# Patient Record
Sex: Male | Born: 1945 | Race: White | Hispanic: No | Marital: Married | Smoking: Never smoker
Health system: Southern US, Community
[De-identification: ages and names within clinical notes are randomized; demographics above are authoritative.]

## PROBLEM LIST (undated history)

## (undated) DIAGNOSIS — E785 Hyperlipidemia, unspecified: Secondary | ICD-10-CM

## (undated) DIAGNOSIS — N200 Calculus of kidney: Secondary | ICD-10-CM

## (undated) DIAGNOSIS — M1990 Unspecified osteoarthritis, unspecified site: Secondary | ICD-10-CM

## (undated) DIAGNOSIS — I10 Essential (primary) hypertension: Secondary | ICD-10-CM

## (undated) DIAGNOSIS — I4891 Unspecified atrial fibrillation: Secondary | ICD-10-CM

## (undated) DIAGNOSIS — N189 Chronic kidney disease, unspecified: Secondary | ICD-10-CM

## (undated) DIAGNOSIS — M109 Gout, unspecified: Secondary | ICD-10-CM

## (undated) DIAGNOSIS — D044 Carcinoma in situ of skin of scalp and neck: Secondary | ICD-10-CM

## (undated) DIAGNOSIS — K219 Gastro-esophageal reflux disease without esophagitis: Secondary | ICD-10-CM

## (undated) HISTORY — PX: REPLACEMENT TOTAL KNEE: SUR1224

## (undated) HISTORY — PX: CATARACT EXTRACTION: SUR2

## (undated) HISTORY — PX: COLONOSCOPY WITH ESOPHAGOGASTRODUODENOSCOPY (EGD): SHX5779

## (undated) HISTORY — DX: Carcinoma in situ of skin of scalp and neck: D04.4

---

## 2006-10-21 ENCOUNTER — Ambulatory Visit: Payer: Self-pay | Admitting: Unknown Physician Specialty

## 2009-12-26 ENCOUNTER — Ambulatory Visit: Payer: Self-pay | Admitting: General Practice

## 2012-04-25 ENCOUNTER — Ambulatory Visit: Payer: Self-pay | Admitting: General Practice

## 2012-04-25 LAB — URINALYSIS, COMPLETE
Bacteria: NONE SEEN
Blood: NEGATIVE
Glucose,UR: NEGATIVE mg/dL (ref 0–75)
Ketone: NEGATIVE
Protein: NEGATIVE
Specific Gravity: 1.015 (ref 1.003–1.030)
Squamous Epithelial: NONE SEEN
WBC UR: 3 /HPF (ref 0–5)

## 2012-04-25 LAB — BASIC METABOLIC PANEL
Anion Gap: 9 (ref 7–16)
BUN: 11 mg/dL (ref 7–18)
EGFR (African American): >60
Glucose: 96 mg/dL (ref 65–99)
Osmolality: 281 (ref 275–301)
Sodium: 141 mmol/L (ref 136–145)

## 2012-04-25 LAB — CBC
HGB: 14.8 g/dL (ref 13.0–18.0)
Platelet: 178 10*3/uL (ref 150–440)
RBC: 4.73 10*6/uL (ref 4.40–5.90)
WBC: 5.5 10*3/uL (ref 3.8–10.6)

## 2012-04-25 LAB — APTT: Activated PTT: 30.5 secs (ref 23.6–35.9)

## 2012-04-25 LAB — PROTIME-INR
INR: 1
Prothrombin Time: 13.2 secs (ref 11.5–14.7)

## 2012-04-25 LAB — MRSA PCR SCREENING

## 2012-04-26 LAB — URINE CULTURE

## 2012-05-09 ENCOUNTER — Inpatient Hospital Stay: Payer: Self-pay

## 2012-05-10 LAB — BASIC METABOLIC PANEL
Anion Gap: 9 (ref 7–16)
BUN: 11 mg/dL (ref 7–18)
Calcium, Total: 7.8 mg/dL — ABNORMAL LOW (ref 8.5–10.1)
Chloride: 102 mmol/L (ref 98–107)
Co2: 26 mmol/L (ref 21–32)
EGFR (Non-African Amer.): >60
Osmolality: 274 (ref 275–301)
Potassium: 4 mmol/L (ref 3.5–5.1)
Sodium: 137 mmol/L (ref 136–145)

## 2012-05-10 LAB — HEMOGLOBIN: HGB: 13.1 g/dL (ref 13.0–18.0)

## 2012-05-11 LAB — BASIC METABOLIC PANEL
Anion Gap: 7 (ref 7–16)
BUN: 8 mg/dL (ref 7–18)
Calcium, Total: 7.6 mg/dL — ABNORMAL LOW (ref 8.5–10.1)
Chloride: 100 mmol/L (ref 98–107)
Co2: 27 mmol/L (ref 21–32)
EGFR (Non-African Amer.): >60
Potassium: 3.6 mmol/L (ref 3.5–5.1)

## 2012-05-11 LAB — HEMOGLOBIN: HGB: 12.2 g/dL — ABNORMAL LOW (ref 13.0–18.0)

## 2012-05-11 LAB — PLATELET COUNT: Platelet: 135 10*3/uL — ABNORMAL LOW (ref 150–440)

## 2014-05-26 DIAGNOSIS — M1990 Unspecified osteoarthritis, unspecified site: Secondary | ICD-10-CM | POA: Insufficient documentation

## 2014-05-26 DIAGNOSIS — M109 Gout, unspecified: Secondary | ICD-10-CM | POA: Diagnosis present

## 2014-05-26 DIAGNOSIS — I10 Essential (primary) hypertension: Secondary | ICD-10-CM | POA: Diagnosis present

## 2014-07-09 DIAGNOSIS — D044 Carcinoma in situ of skin of scalp and neck: Secondary | ICD-10-CM

## 2014-07-09 DIAGNOSIS — C4492 Squamous cell carcinoma of skin, unspecified: Secondary | ICD-10-CM

## 2014-07-09 HISTORY — DX: Squamous cell carcinoma of skin, unspecified: C44.92

## 2014-07-09 HISTORY — DX: Carcinoma in situ of skin of scalp and neck: D04.4

## 2014-09-07 DIAGNOSIS — E782 Mixed hyperlipidemia: Secondary | ICD-10-CM | POA: Insufficient documentation

## 2015-03-10 NOTE — Discharge Summary (Signed)
PATIENT NAMESONYA, [name] MR#:  [id number] DATE OF BIRTH:  [birth year]  DATE OF ADMISSION:  05/09/2012 DATE OF DISCHARGE:  05/12/2012  ADMITTING DIAGNOSIS: Degenerative arthrosis of the right knee.   DISCHARGE DIAGNOSIS: Degenerative arthrosis of the right knee.   HISTORY OF PRESENT ILLNESS: The patient is a 69 year old who has been [facility] of right knee pain. The patient had been experiencing discomfort in the right knee for approximately 2-1/2 to 3 years. There was no specific injury or any change of activities. The pain had initially began when he was walking. He was also noted to have pain with certain ways that he would twist the knee. He had not seen any significant improvement in his condition despite antiinflammatory medication. He states he has been having some problems off and on over the last 15 to 20 years secondary to the fact that he was doing judo and would subsequently twist the knee and reinjured it. The patient also states that at times he would be running, he used to run 14 to 15 miles at a time. The patient did undergo a series of Synvisc injections with only minimal relief. He had continued to have pain and subsequently had elected to proceed with total knee arthroplasty. The patient states that the pain had increased to the point that it was significantly interfering with his activities of daily living. X-rays taken in the clinic showed significant narrowing of the medial cartilage space with near bone-on-bone articulation noted. There was subchondral sclerosis noted. He was noted to have relative varus alignment. There was also evidence of degenerative changes to the patellofemoral articulation. After discussion of the risks and benefits of surgical intervention, the patient expressed his understanding of the risks and benefits and agreed with plans for surgical intervention.   PROCEDURE: Right total knee arthroplasty using computer-assisted  navigation.   ANESTHESIA: Femoral nerve block with spinal.   SOFT TISSUE RELEASE: Anterior cruciate ligament, posterior cruciate ligament, deep and superficial medial collateral ligaments, as well as patellofemoral ligament.   IMPLANTS UTILIZED: DePuy PFC Sigma size 4 posterior stabilized femoral component (cemented), size 5 MBT tibial component (cemented), 38 mm three pegged oval dome patella (cemented), and a 12.5 mm stabilized rotating platform polyethylene insert.   HOSPITAL COURSE: The patient tolerated the procedure very well. He had no complications. He was then taken to PAC-U where he was stabilized and then transferred to the orthopedic floor. The patient began receiving anticoagulation therapy of Lovenox 30 mg subcutaneous every 12 hours per anesthesia protocol. He was fitted with TED stockings bilaterally. These were allowed to be removed one hour per eight hour shift. The right one was applied on day two following removal of the Hemovac and dressing change. The patient was also fitted with the AV-I compression foot pumps set at 80 mmHg bilaterally. His calves have been nontender. There has been no evidence of any deep venous thromboses. Negative Homans sign. The patient's heels were elevated off the bed using rolled towels. He has voiced no complaints.   The patient has denied any chest pain or any shortness of breath. Vital signs have been stable. He has been afebrile. Hemodynamically he was stable and no transfusions were given other than the Autovac transfusions given the first six hours postoperatively.   Physical therapy was initiated on day one for gait training and transfers. Upon being discharged, he was ambulating greater than 200 feet. He was able go up and down four sets of steps.  He was independent with bed to chair transfers. The patient's range of motion was 0 to 96 degrees. Occupational therapy was also initiated on day one for activities of daily living and assistive devices.    The patient's IV, Foley and Hemovac were all discontinued on day two along with a dressing change. The wound was free of any drainage or any signs of infection. Polar Care was reapplied to the surgical leg maintaining a temperature of 40 to 50 degrees Fahrenheit.   The patient is being discharged to home in improved stable condition. He may weight bear as tolerated. Continue using a walker until cleared by physical therapy to go to a quad cane. He will receive home health physical therapy. He is to continue wearing TED stockings. These are to be worn during the day but may be removed at night. He is to continue to use the Polar Care as much as he can around-the-clock until seen in the office in two weeks. He does have a follow-up appointment in the clinic in two weeks. He is to call the clinic sooner if any temperatures of 101.5 or greater or excessive bleeding. He is placed on a regular diet. He is given a prescription for Lovenox 40 mg subcutaneously daily for 14 days and then discontinue and begin taking one 81 mg enteric-coated aspirin, also a prescription for Roxicodone 5 to 10 mg every 4 to 6 hours p.r.n. for pain and Ultram 50 mg 1 to 2 tablets every 4 to 6 hours p.r.n. for pain. He is to resume his regular medications that he was on prior to admission.   PAST MEDICAL HISTORY:  1. Hypertension.  2. Hyperlipidemia.  3. Gout.  4. Shingles.  5. Cataracts.  6. Kidney stones. ____________________________ Vance Peper, PA [id number] D: 05/12/2012 06:53:08 ET T: 05/12/2012 16:04:58 ET JOB#: [id number]  cc: Vance Peper, PA, <Dictator> [place] PA ELECTRONICALLY SIGNED 05/14/2012 21:09

## 2015-03-10 NOTE — Op Note (Signed)
PATIENT NAMEARLING, CERONE MR#:  [id number] DATE OF BIRTH:  April 21, 1946  DATE OF PROCEDURE:  05/09/2012  PREOPERATIVE DIAGNOSIS: Degenerative arthrosis of the right knee.   POSTOPERATIVE DIAGNOSIS: Degenerative arthrosis of the right knee.   PROCEDURE PERFORMED: Right total knee arthroplasty using computer-assisted navigation.   SURGEON: Laurice Record. Holley Bouche., MD   ASSISTANT: Vance Peper, PA-C (required to maintain retraction throughout the procedure)   ANESTHESIA: Femoral nerve block and spinal.   ESTIMATED BLOOD LOSS: 50 mL.    FLUIDS REPLACED: 1500 mL of crystalloid.   TOURNIQUET TIME: 95 minutes.   DRAINS: Two medium drains to reinfusion system.   SOFT TISSUE RELEASES: Anterior cruciate ligament, posterior cruciate ligament, deep and superficial medial collateral ligament, and patellofemoral ligament.   IMPLANTS UTILIZED: DePuy PFC Sigma size 4 posterior stabilized femoral component (cemented), size 5 MBT tibial component (cemented), 38 mm three-peg oval dome patella (cemented), and a 12.5 mm stabilized rotating platform polyethylene insert.   INDICATIONS FOR SURGERY: The patient is a 69 year old male who has been seen for complaints of progressive right knee pain. X-rays demonstrated severe degenerative changes in tricompartmental fashion with varus deformity. After discussion of the risks and benefits of surgical intervention, the patient expressed his understanding of the risks, benefits, and agreed with plans for surgical intervention.   PROCEDURE IN DETAIL: The patient was brought into the Operating Room, and after adequate femoral nerve block and spinal anesthesia was achieved, a tourniquet was placed on the patient's upper right thigh. The patient's right knee and leg were cleaned and prepped with alcohol and DuraPrep and draped in the usual sterile fashion. A "timeout" was performed as per usual protocol. The right lower extremity was exsanguinated using an Esmarch, and the  tourniquet was inflated to 300 mmHg. An anterior longitudinal incision was made followed by a standard mid vastus approach. A large effusion was evacuated. The deep fibers of the medial collateral ligament were elevated in a subperiosteal fashion off the medial flare of the tibia so as to maintain a continuous soft tissue sleeve. The patella was subluxed laterally, and the patellofemoral ligament was incised. Inspection of the knee demonstrated severe degenerative changes in tricompartmental fashion with eburnated bone noted to the medial compartment. Prominent osteophytes were debrided using a rongeur. Anterior and posterior cruciate ligaments were excised. Two 4.0 mm Schanz pins were inserted into the femur and into the tibia for attachment of the ray of trackers used for computer-assisted navigation. Hip center was identified using a circumduction technique. Distal landmarks were mapped using the computer. The distal femur and proximal tibia were mapped using the computer. The distal femoral cutting guide was positioned using computer-assisted navigation so as to achieve 5 degrees distal valgus cut. Cut was performed and verified using the computer. The distal femur was sized, and it was felt that a size 4 femoral component was appropriate. A size 4 cutting guide was positioned, and the anterior cut was performed and verified using the computer. This was followed by completion of the posterior and chamfer cuts. A femoral cutting guide for a central box was then positioned and the central box cut was performed.   Attention was then directed to the proximal tibia. Medial and lateral menisci were excised. The extramedullary tibial cutting guide was positioned using computer-assisted navigation so as to achieve 0-degrees varus-valgus alignment and 0-degrees posterior slope. Cut was performed and verified using the computer. The proximal tibia was sized, and it was felt that a size 5 tibial tray  was appropriate.  Tibial and femoral trials were inserted, followed by insertion of a 10 mm polyethylene insert. The knee was felt to be tight medially. A Cobb elevator was used to elevate the superficial fibers of the medial collateral ligament. The 10 mm polyethylene trial was replaced by a 12.5 mm polyethylene trial. Excellent medial and lateral soft tissue balancing was appreciated both in full extension and in 90 degrees of flexion. Finally, the patella was cut and prepared so as to accommodate a 38 mm three peg oval dome patella. A patellar trial was placed, and the knee was placed through a range of motion with excellent patellar tracking appreciated.  The femoral trial was removed. A central post hole for the tibial component was reamed, followed by insertion of a keel punch. Tibial trials were then removed. The cut surfaces of bone were irrigated with copious amounts of normal saline with antibiotic solution using pulsatile lavage and then suctioned dry. Polymethylmethacrylate cement was prepared in the usual fashion using a vacuum mixer. Cement was applied to the cut surface of the proximal tibia as well as along the undersurface of a size 5 MBT tibial component. The tibial component was positioned and impacted into place. Excess cement was removed using freer elevators. Cement was then applied to the cut surface of the femur as well as along the posterior flanges of a size 4 posterior stabilized femoral component. The femoral component was positioned and impacted into place. Excess cement was removed using freer elevators. A 12.5 mm polyethylene trial was inserted, and the knee was brought into full extension with steady axial compression applied. Finally, cement was applied to the backside of a 38 mm three peg oval dome patella, and the patellar component was positioned and patellar clamp applied. Excess cement was removed using freer elevators.   After adequate curing of cement, the tourniquet was deflated after a  total tourniquet time of 95 minutes. Hemostasis was achieved using electrocautery. The knee was irrigated with copious amounts of normal saline with antibiotic solution using pulsatile lavage and then suctioned dry. The knee was inspected for any residual cement debris, and 30 mL of 0.25% Marcaine with epinephrine was injected along the posterior capsule. A 12.5 mm stabilized rotating platform polyethylene insert was inserted, and the knee was placed through a range of motion. Excellent medial and lateral soft tissue balancing was appreciated. Excellent patellar tracking was noted.   Two medium drains were placed in the wound bed and brought out through a separate stab incision to be attached to a reinfusion system. The medial parapatellar portion of the incision was reapproximated using interrupted sutures of #1 Vicryl. The subcutaneous tissue was approximated in layers using first #0 Vicryl followed by 2-0 Vicryl. The skin was closed with skin staples. A sterile dressing was applied.   The patient tolerated the procedure well. He was transported to the recovery room in stable condition.    ____________________________ Laurice Record. Holley Bouche., MD jph:cbb D: 05/09/2012 14:49:36 ET T: 05/09/2012 16:30:01 ET JOB#: [id number]  cc: Jeneen Rinks P. Holley Bouche., MD, <Dictator> JAMES P Holley Bouche MD ELECTRONICALLY SIGNED 05/12/2012 22:42

## 2015-11-29 DIAGNOSIS — Z961 Presence of intraocular lens: Secondary | ICD-10-CM | POA: Diagnosis not present

## 2016-01-07 DIAGNOSIS — J101 Influenza due to other identified influenza virus with other respiratory manifestations: Secondary | ICD-10-CM | POA: Diagnosis not present

## 2016-01-07 DIAGNOSIS — R05 Cough: Secondary | ICD-10-CM | POA: Diagnosis not present

## 2016-01-07 DIAGNOSIS — R6883 Chills (without fever): Secondary | ICD-10-CM | POA: Diagnosis not present

## 2016-02-25 ENCOUNTER — Emergency Department: Payer: PPO

## 2016-02-25 ENCOUNTER — Inpatient Hospital Stay
Admission: EM | Admit: 2016-02-25 | Discharge: 2016-02-26 | DRG: 195 | Disposition: A | Discharge status: Home / Self Care | Payer: PPO | Attending: Internal Medicine | Admitting: Internal Medicine

## 2016-02-25 ENCOUNTER — Encounter: Payer: Self-pay | Admitting: Emergency Medicine

## 2016-02-25 DIAGNOSIS — E785 Hyperlipidemia, unspecified: Secondary | ICD-10-CM | POA: Diagnosis present

## 2016-02-25 DIAGNOSIS — Z7982 Long term (current) use of aspirin: Secondary | ICD-10-CM | POA: Diagnosis not present

## 2016-02-25 DIAGNOSIS — Z800 Family history of malignant neoplasm of digestive organs: Secondary | ICD-10-CM | POA: Diagnosis not present

## 2016-02-25 DIAGNOSIS — J189 Pneumonia, unspecified organism: Secondary | ICD-10-CM | POA: Diagnosis not present

## 2016-02-25 DIAGNOSIS — Z888 Allergy status to other drugs, medicaments and biological substances status: Secondary | ICD-10-CM | POA: Diagnosis not present

## 2016-02-25 DIAGNOSIS — R0602 Shortness of breath: Secondary | ICD-10-CM | POA: Diagnosis not present

## 2016-02-25 DIAGNOSIS — R0902 Hypoxemia: Secondary | ICD-10-CM | POA: Diagnosis not present

## 2016-02-25 DIAGNOSIS — Z882 Allergy status to sulfonamides status: Secondary | ICD-10-CM | POA: Diagnosis not present

## 2016-02-25 DIAGNOSIS — I10 Essential (primary) hypertension: Secondary | ICD-10-CM | POA: Diagnosis not present

## 2016-02-25 DIAGNOSIS — D72829 Elevated white blood cell count, unspecified: Secondary | ICD-10-CM | POA: Diagnosis not present

## 2016-02-25 DIAGNOSIS — Z9849 Cataract extraction status, unspecified eye: Secondary | ICD-10-CM | POA: Diagnosis not present

## 2016-02-25 DIAGNOSIS — I4891 Unspecified atrial fibrillation: Secondary | ICD-10-CM | POA: Diagnosis not present

## 2016-02-25 DIAGNOSIS — R079 Chest pain, unspecified: Secondary | ICD-10-CM | POA: Diagnosis not present

## 2016-02-25 DIAGNOSIS — Z79899 Other long term (current) drug therapy: Secondary | ICD-10-CM

## 2016-02-25 DIAGNOSIS — J181 Lobar pneumonia, unspecified organism: Secondary | ICD-10-CM | POA: Diagnosis not present

## 2016-02-25 DIAGNOSIS — Z833 Family history of diabetes mellitus: Secondary | ICD-10-CM

## 2016-02-25 DIAGNOSIS — Z96651 Presence of right artificial knee joint: Secondary | ICD-10-CM | POA: Diagnosis not present

## 2016-02-25 DIAGNOSIS — R0789 Other chest pain: Secondary | ICD-10-CM | POA: Diagnosis not present

## 2016-02-25 HISTORY — DX: Hyperlipidemia, unspecified: E78.5

## 2016-02-25 HISTORY — DX: Essential (primary) hypertension: I10

## 2016-02-25 LAB — BASIC METABOLIC PANEL
ANION GAP: 4 — AB (ref 5–15)
BUN: 15 mg/dL (ref 6–20)
CHLORIDE: 105 mmol/L (ref 101–111)
CO2: 28 mmol/L (ref 22–32)
CREATININE: 0.99 mg/dL (ref 0.61–1.24)
Calcium: 8.9 mg/dL (ref 8.9–10.3)
GFR calc non Af Amer: >60 mL/min (ref >60)
Glucose, Bld: 107 mg/dL — ABNORMAL HIGH (ref 65–99)
POTASSIUM: 3.6 mmol/L (ref 3.5–5.1)
Sodium: 137 mmol/L (ref 135–145)

## 2016-02-25 LAB — CBC
HEMATOCRIT: 44.9 % (ref 40.0–52.0)
HEMOGLOBIN: 14.9 g/dL (ref 13.0–18.0)
MCH: 30.2 pg (ref 26.0–34.0)
MCHC: 33.2 g/dL (ref 32.0–36.0)
MCV: 91.1 fL (ref 80.0–100.0)
Platelets: 177 10*3/uL (ref 150–440)
RBC: 4.93 MIL/uL (ref 4.40–5.90)
RDW: 13.5 % (ref 11.5–14.5)
WBC: 11.7 10*3/uL — ABNORMAL HIGH (ref 3.8–10.6)

## 2016-02-25 LAB — FIBRIN DERIVATIVES D-DIMER (ARMC ONLY): Fibrin derivatives D-dimer (ARMC): 953 — ABNORMAL HIGH (ref 0–499)

## 2016-02-25 LAB — INFLUENZA PANEL BY PCR (TYPE A & B)
H1N1 flu by pcr: NOT DETECTED
INFLAPCR: NEGATIVE
Influenza B By PCR: NEGATIVE

## 2016-02-25 LAB — STREP PNEUMONIAE URINARY ANTIGEN: Strep Pneumo Urinary Antigen: NEGATIVE

## 2016-02-25 LAB — TROPONIN I
Troponin I: <0.03 ng/mL (ref <0.031)
Troponin I: <0.03 ng/mL (ref <0.031)

## 2016-02-25 MED ORDER — DEXTROSE 5 % IV SOLN
500.0000 mg | Freq: Once | INTRAVENOUS | Status: AC
  Administered 2016-02-25: 10:00:00 500 mg via INTRAVENOUS
  Filled 2016-02-25: qty 500

## 2016-02-25 MED ORDER — ASPIRIN 81 MG PO CHEW
324.0000 mg | CHEWABLE_TABLET | Freq: Once | ORAL | Status: AC
  Administered 2016-02-25: 324 mg via ORAL

## 2016-02-25 MED ORDER — LISINOPRIL 20 MG PO TABS
40.0000 mg | ORAL_TABLET | Freq: Every day | ORAL | Status: DC
  Administered 2016-02-25: 11:00:00 20 mg via ORAL
  Filled 2016-02-25: qty 2

## 2016-02-25 MED ORDER — DEXTROSE 5 % IV SOLN
1.0000 g | INTRAVENOUS | Status: DC
  Administered 2016-02-26: 08:00:00 1 g via INTRAVENOUS
  Filled 2016-02-25: qty 10

## 2016-02-25 MED ORDER — DEXTROSE 5 % IV SOLN
500.0000 mg | INTRAVENOUS | Status: DC
  Administered 2016-02-26: 500 mg via INTRAVENOUS
  Filled 2016-02-25: qty 500

## 2016-02-25 MED ORDER — SODIUM CHLORIDE 0.9 % IV SOLN: 250.0000 mL | INTRAVENOUS | Status: DC | PRN

## 2016-02-25 MED ORDER — ASPIRIN 81 MG PO CHEW
CHEWABLE_TABLET | ORAL | Status: AC
  Administered 2016-02-25: 324 mg via ORAL
  Filled 2016-02-25: qty 4

## 2016-02-25 MED ORDER — PANTOPRAZOLE SODIUM 40 MG PO TBEC
40.0000 mg | DELAYED_RELEASE_TABLET | Freq: Every day | ORAL | Status: DC
  Administered 2016-02-25 – 2016-02-26 (×2): 40 mg via ORAL
  Filled 2016-02-25 (×2): qty 1

## 2016-02-25 MED ORDER — PRAVASTATIN SODIUM 40 MG PO TABS
40.0000 mg | ORAL_TABLET | Freq: Every day | ORAL | Status: DC
  Administered 2016-02-25: 40 mg via ORAL
  Filled 2016-02-25: qty 1

## 2016-02-25 MED ORDER — SODIUM CHLORIDE 0.9% FLUSH
3.0000 mL | INTRAVENOUS | Status: DC | PRN
  Administered 2016-02-25: 3 mL via INTRAVENOUS
  Filled 2016-02-25: qty 3

## 2016-02-25 MED ORDER — GUAIFENESIN 100 MG/5ML PO SOLN
5.0000 mL | ORAL | Status: DC | PRN
  Administered 2016-02-25: 11:00:00 200 mg via ORAL
  Administered 2016-02-25 – 2016-02-26 (×2): 100 mg via ORAL
  Filled 2016-02-25 (×4): qty 10

## 2016-02-25 MED ORDER — SODIUM CHLORIDE 0.9% FLUSH
3.0000 mL | Freq: Two times a day (BID) | INTRAVENOUS | Status: DC
  Administered 2016-02-25 – 2016-02-26 (×2): 3 mL via INTRAVENOUS

## 2016-02-25 MED ORDER — IPRATROPIUM-ALBUTEROL 0.5-2.5 (3) MG/3ML IN SOLN
3.0000 mL | Freq: Four times a day (QID) | RESPIRATORY_TRACT | Status: DC
  Administered 2016-02-25 – 2016-02-26 (×4): 3 mL via RESPIRATORY_TRACT
  Filled 2016-02-25 (×4): qty 3

## 2016-02-25 MED ORDER — NITROGLYCERIN 0.4 MG SL SUBL
0.4000 mg | SUBLINGUAL_TABLET | SUBLINGUAL | Status: DC | PRN
  Administered 2016-02-25 (×2): 0.4 mg via SUBLINGUAL

## 2016-02-25 MED ORDER — IOPAMIDOL (ISOVUE-370) INJECTION 76%
75.0000 mL | Freq: Once | INTRAVENOUS | Status: AC | PRN
  Administered 2016-02-25: 75 mL via INTRAVENOUS

## 2016-02-25 MED ORDER — LISINOPRIL 20 MG PO TABS
20.0000 mg | ORAL_TABLET | Freq: Two times a day (BID) | ORAL | Status: DC
  Administered 2016-02-25 – 2016-02-26 (×2): 20 mg via ORAL
  Filled 2016-02-25 (×2): qty 1

## 2016-02-25 MED ORDER — ENOXAPARIN SODIUM 40 MG/0.4ML ~~LOC~~ SOLN
40.0000 mg | SUBCUTANEOUS | Status: DC
  Administered 2016-02-25: 20:00:00 40 mg via SUBCUTANEOUS
  Filled 2016-02-25: qty 0.4

## 2016-02-25 MED ORDER — NITROGLYCERIN 0.4 MG SL SUBL
SUBLINGUAL_TABLET | SUBLINGUAL | Status: AC
  Administered 2016-02-25: 0.4 mg via SUBLINGUAL
  Filled 2016-02-25: qty 1

## 2016-02-25 MED ORDER — ASPIRIN EC 81 MG PO TBEC
81.0000 mg | DELAYED_RELEASE_TABLET | Freq: Every day | ORAL | Status: DC
  Administered 2016-02-25 – 2016-02-26 (×2): 81 mg via ORAL
  Filled 2016-02-25 (×2): qty 1

## 2016-02-25 MED ORDER — DEXTROSE 5 % IV SOLN
1.0000 g | Freq: Once | INTRAVENOUS | Status: AC
  Administered 2016-02-25: 1 g via INTRAVENOUS
  Filled 2016-02-25: qty 10

## 2016-02-25 NOTE — ED Notes (Signed)
Pt to triage via w/c with no distress noted; pt reports awoke with mid CP radiating into neck; denies any accomp symptoms, denies hx of same

## 2016-02-25 NOTE — H&P (Addendum)
[facility] [place] NAME: [name]    MR#:  [id number]  DATE OF BIRTH:  1946-06-26  DATE OF ADMISSION:  02/25/2016  PRIMARY CARE PHYSICIAN: Juluis Pitch, MD   REQUESTING/REFERRING PHYSICIAN: Lavonia Drafts, MD  CHIEF COMPLAINT:   Chief Complaint  Patient presents with  . Chest Pain   chest pain and dyspnea today.  HISTORY OF PRESENT ILLNESS:  Rodin Pullium  is a 70 y.o. male with a known history of hypertension and hyperlipidemia. The patient presented to the ED with chest pain on the right side with radiation to the right side of the neck, exacerbated by breathing. He also complains of dyspnea, but he denies any palpitation, orthopnea or nocturnal dyspnea or leg edema. She had got cold and fever last weekend.Chest x-ray didn't show any infiltrate but CT angiogram showed right upper lobe pneumonia without PE. He was found hypoxia with oxygen saturation decreased to 88% and put on oxygen 4 L in the ED.  PAST MEDICAL HISTORY:   Past Medical History  Diagnosis Date  . Hypertension   . Hyperlipidemia     PAST SURGICAL HISTORY:   Past Surgical History  Procedure Laterality Date  . Replacement total knee Right   . Cataract extraction      SOCIAL HISTORY:   Social History  Substance Use Topics  . Smoking status: Never Smoker   . Smokeless tobacco: Not on file  . Alcohol Use: No    FAMILY HISTORY:   Family History  Problem Relation Age of Onset  . Cancer Mother   . Liver cancer Father   . Diabetes Father     DRUG ALLERGIES:   Allergies  Allergen Reactions  . Lipitor [Atorvastatin] Other (See Comments)    Bone and muscle pain.  . Sulfa Antibiotics Hives and Rash    REVIEW OF SYSTEMS:  CONSTITUTIONAL: No fever, fatigue or weakness.  EYES: No blurred or double vision.  EARS, NOSE, AND THROAT: No tinnitus or ear pain.  RESPIRATORY: has cough, shortness of breath, wheezing no hemoptysis.   CARDIOVASCULAR: Has  chest pain, no orthopnea, edema.  GASTROINTESTINAL: No nausea, vomiting, diarrhea or abdominal pain.  GENITOURINARY: No dysuria, hematuria.  ENDOCRINE: No polyuria, nocturia,  HEMATOLOGY: No anemia, easy bruising or bleeding SKIN: No rash or lesion. MUSCULOSKELETAL: No joint pain or arthritis.   NEUROLOGIC: No tingling, numbness, weakness.  PSYCHIATRY: No anxiety or depression.   MEDICATIONS AT HOME:   Prior to Admission medications   Medication Sig Start Date End Date Taking? Authorizing Provider  aspirin EC 81 MG tablet Take 81 mg by mouth daily.   Yes Historical Provider, MD  glucosamine-chondroitin 500-400 MG tablet Take 1 tablet by mouth 2 (two) times daily.   Yes Historical Provider, MD  Grape Seed Extract 100 MG CAPS Take 1 capsule by mouth 2 (two) times daily.   Yes Historical Provider, MD  lisinopril (PRINIVIL,ZESTRIL) 20 MG tablet Take 2 tablets by mouth daily. 02/10/16  Yes Historical Provider, MD  lovastatin (MEVACOR) 40 MG tablet Take 1 tablet by mouth every evening. 02/18/16  Yes Historical Provider, MD  Multiple Vitamin (MULTIVITAMIN WITH MINERALS) TABS tablet Take 1 tablet by mouth daily.   Yes Historical Provider, MD  omeprazole (PRILOSEC) 20 MG capsule Take 1 capsule by mouth daily. 02/05/16  Yes Historical Provider, MD  sildenafil (REVATIO) 20 MG tablet Take 1 tablet by mouth as needed. 01/31/16  Yes Historical Provider, MD  VITAL SIGNS:  Blood pressure 132/90, pulse 81, temperature 98.3 F (36.8 C), temperature source Oral, resp. rate 16, height 5\' 10"  (1.778 m), weight 99.791 kg (220 lb), SpO2 95 %.  PHYSICAL EXAMINATION:  GENERAL:  70 y.o.-year-old patient lying in the bed with no acute distress.  obese. EYES: Pupils equal, round, reactive to light and accommodation. No scleral icterus. Extraocular muscles intact.  HEENT: Head atraumatic, normocephalic. Oropharynx and nasopharynx clear.  moist oral mucosa. NECK:  Supple, no jugular venous  distention. No thyroid enlargement, no tenderness.  LUNGS: Normal breath sounds bilaterally, no wheezing, rales,rhonchi or crepitation. No use of accessory muscles of respiration.  CARDIOVASCULAR: S1, S2 normal. No murmurs, rubs, or gallops.  ABDOMEN: Soft, nontender, nondistended. Bowel sounds present. No organomegaly or mass.  EXTREMITIES: No pedal edema, cyanosis, or clubbing.  NEUROLOGIC: Cranial nerves II through XII are intact. Muscle strength 5/5 in all extremities. Sensation intact. Gait not checked.  PSYCHIATRIC: The patient is alert and oriented x 3.  SKIN: No obvious rash, lesion, or ulcer.   LABORATORY PANEL:   CBC  Recent Labs Lab 02/25/16 0325  WBC 11.7*  HGB 14.9  HCT 44.9  PLT 177   ------------------------------------------------------------------------------------------------------------------  Chemistries   Recent Labs Lab 02/25/16 0325  NA 137  K 3.6  CL 105  CO2 28  GLUCOSE 107*  BUN 15  CREATININE 0.99  CALCIUM 8.9   ------------------------------------------------------------------------------------------------------------------  Cardiac Enzymes  Recent Labs Lab 02/25/16 0628  TROPONINI <0.03   ------------------------------------------------------------------------------------------------------------------  RADIOLOGY:  Ct Angio Chest Pe W/cm &/or Wo Cm  02/25/2016  CLINICAL DATA:  70 year old male with right side chest pain since 0100 hours. Shortness of breath. Influenza recently. Initial encounter. EXAM: CT ANGIOGRAPHY CHEST WITH CONTRAST TECHNIQUE: Multidetector CT imaging of the chest was performed using the standard protocol during bolus administration of intravenous contrast. Multiplanar CT image reconstructions and MIPs were obtained to evaluate the vascular anatomy. CONTRAST:  75 mL Isovue 370 COMPARISON:  Portable chest x-ray 0334 hours today. FINDINGS: Adequate contrast bolus timing in the pulmonary arterial tree. Mild respiratory  motion artifact at the lung bases. No focal filling defect identified in the pulmonary arterial system to suggest the presence of acute pulmonary embolism. Major airways are patent aside from atelectatic changes. Depending ground-glass opacity in both lungs most compatible with atelectasis. Superimposed peri-bronchovascular nodularity throughout the right upper lobe, with patchy/indistinct small nodules (e.g. Series 6, images 29 and 34). No definite involvement of the right middle or lower lobes. No definite involvement of the left lung. Left lower lobe calcified granuloma on series 6, image 73. No superimposed pleural effusion. Cardiomegaly. No pericardial effusion. Mildly tortuous thoracic aorta. Minimal evidence of aortic and coronary artery calcified atherosclerosis. No mediastinal or hilar lymphadenopathy. Negative thoracic inlet. No axillary lymphadenopathy. Negative visualized liver (small benign inferior right lobe hemangioma or vascular phenomena series 4, image 142), spleen, pancreas, adrenal glands, and kidneys. Small dependent gallstones within the gallbladder, no pericholecystic inflammation. Diverticulosis at the splenic flexure. Small gastric hiatal hernia. Otherwise negative visualized bowel. No acute osseous abnormality identified. Review of the MIP images confirms the above findings. IMPRESSION: 1.  No evidence of acute pulmonary embolus. 2. Low lung volumes with atelectasis. Superimposed right upper lobe peribronchial nodularity most compatible with acute right upper lobe infection. No pleural effusion. 3. Cardiomegaly.  Cholelithiasis.  Left colon diverticulosis. Electronically Signed   By: Genevie Ann M.D.   On: 02/25/2016 07:42   Dg Chest Portable 1 View  02/25/2016  CLINICAL DATA:  Awakened at 01:00 with chest pain radiating into the right neck. EXAM: PORTABLE CHEST 1 VIEW COMPARISON:  None. FINDINGS: A single AP portable view of the chest demonstrates no focal airspace consolidation or  alveolar edema. The lungs are grossly clear. There is no large effusion or pneumothorax. Cardiac and mediastinal contours appear unremarkable. IMPRESSION: No active disease. Electronically Signed   By: Andreas Newport M.D.   On: 02/25/2016 03:41    EKG:   Orders placed or performed during the hospital encounter of 02/25/16  . EKG 12-Lead  . EKG 12-Lead  . ED EKG  . ED EKG  . ED EKG  . ED EKG    IMPRESSION AND PLAN:   Pneumonia, CAP with hypoxia and the leukocytosis  The patient will be admitted to medical floor. Continue Zithromax and Rocephin, follow-up CBC, blood culture and sputum culture, continue oxygen by nasal cannula and DuoNeb when necessary.  Chest pain, possible due to pneumonia. Troponins are negative. Continue aspirin and Pravachol.  Hypertension. Continue lisinopril. Hyperlipidemia. Continue Pravachol.  All the records are reviewed and case discussed with ED provider. Management plans discussed with the patient, his wife and daughters and they are in agreement.  CODE STATUS: Full code  TOTAL TIME TAKING CARE OF THIS PATIENT: 55 minutes.    Demetrios Loll M.D on 02/25/2016 at 9:21 AM  Between 7am to 6pm - Pager - [phone]  After 6pm go to [email or web address] - password [email or web address] [facility]  Office  [phone]  CC: Primary care physician; Juluis Pitch, MD

## 2016-02-25 NOTE — Progress Notes (Signed)
Pharmacy Antibiotic Note  SAFAL KASTER is a 70 y.o. male admitted on 02/25/2016 with pneumonia.  Pharmacy has been consulted for renal adjustment of antibiotics. Currently ordered Ceftriaxone 1g IV q24h and Azithromycin 500mg  IV q24h.  Plan: This patient's current antibiotics will be continued without adjustments.  Height: 5\' 10"  (177.8 cm) Weight: 220 lb (99.791 kg) IBW/kg (Calculated) : 73  Temp (24hrs), Avg:98.2 F (36.8 C), Min:98 F (36.7 C), Max:98.3 F (36.8 C)   Recent Labs Lab 02/25/16 0325  WBC 11.7*  CREATININE 0.99    Estimated Creatinine Clearance: 83.4 mL/min (by C-G formula based on Cr of 0.99).    Allergies  Allergen Reactions  . Lipitor [Atorvastatin] Other (See Comments)    Bone and muscle pain.  . Sulfa Antibiotics Hives and Rash    Antimicrobials this admission: Ceftriaxone 4/11 >>  Azithromycin 4/11 >>   Dose adjustments this admission:  Microbiology results: 4/11 BCx:   Thank you for allowing pharmacy to be a part of this patient's care.  Paulina Fusi, PharmD, BCPS 02/25/2016 11:56 AM

## 2016-02-25 NOTE — ED Provider Notes (Signed)
[facility] Emergency Department Provider Note  ____________________________________________  Time seen: 3:30 AM  I have reviewed the triage vital signs and the nursing notes.   HISTORY  Chief Complaint Chest Pain     HPI [name] is a 70 y.o. male with past medical history of hypertension presents with history of awakening with mid chest pressure radiating to the left neck. Admits to dyspnea. Patient denies any diaphoresis or nausea. Patient admits to recent upper respiratory infection. Patient notes that the pain is worse with deep inspiration.     Past Medical History  Diagnosis Date  . Hypertension     There are no active problems to display for this patient.   Past Surgical History  Procedure Laterality Date  . Replacement total knee Right   . Cataract extraction      No current outpatient prescriptions on file.  Allergies Lipitor and Sulfa antibiotics  No family history on file.  Social History Social History  Substance Use Topics  . Smoking status: Never Smoker   . Smokeless tobacco: None  . Alcohol Use: No    Review of Systems  Constitutional: Negative for fever. Eyes: Negative for visual changes. ENT: Negative for sore throat. Cardiovascular: Positive for chest pain. Respiratory: Negative for shortness of breath. Gastrointestinal: Negative for abdominal pain, vomiting and diarrhea. Genitourinary: Negative for dysuria. Musculoskeletal: Negative for back pain. Skin: Negative for rash. Neurological: Negative for headaches, focal weakness or numbness.   10-point ROS otherwise negative.  ____________________________________________   PHYSICAL EXAM:  VITAL SIGNS: ED Triage Vitals  Enc Vitals Group     BP 02/25/16 0310 164/102 mmHg     Pulse Rate 02/25/16 0310 83     Resp 02/25/16 0310 20     Temp 02/25/16 0310 98.3 F (36.8 C)     Temp Source 02/25/16 0310 Oral     SpO2 02/25/16 0310 95 %     Weight  02/25/16 0310 220 lb (99.791 kg)     Height 02/25/16 0310 5\' 10"  (1.778 m)     Head Cir --      Peak Flow --      Pain Score 02/25/16 0308 6     Pain Loc --      Pain Edu? --      Excl. in [place]? --     Constitutional: Alert and oriented. Well appearing and in no distress. Eyes: Conjunctivae are normal. PERRL. Normal extraocular movements. ENT   Head: Normocephalic and atraumatic.   Nose: No congestion/rhinnorhea.   Mouth/Throat: Mucous membranes are moist.   Neck: No stridor. Hematological/Lymphatic/Immunilogical: No cervical lymphadenopathy. Cardiovascular: Normal rate, regular rhythm. Normal and symmetric distal pulses are present in all extremities. No murmurs, rubs, or gallops. Respiratory: Normal respiratory effort without tachypnea nor retractions. Breath sounds are clear and equal bilaterally. No wheezes/rales/rhonchi. Gastrointestinal: Soft and nontender. No distention. There is no CVA tenderness. Genitourinary: deferred Musculoskeletal: Nontender with normal range of motion in all extremities. No joint effusions.  No lower extremity tenderness nor edema. Neurologic:  Normal speech and language. No gross focal neurologic deficits are appreciated. Speech is normal.  Skin:  Skin is warm, dry and intact. No rash noted. Psychiatric: Mood and affect are normal. Speech and behavior are normal. Patient exhibits appropriate insight and judgment.  ____________________________________________    LABS (pertinent positives/negatives)  Labs Reviewed  BASIC METABOLIC PANEL - Abnormal; Notable for the following:    Glucose, Bld 107 (*)    Anion gap 4 (*)  All other components within normal limits  CBC - Abnormal; Notable for the following:    WBC 11.7 (*)    All other components within normal limits  TROPONIN I  FIBRIN DERIVATIVES D-DIMER ([facility] ONLY)     ____________________________________________   EKG  ED ECG REPORT I, Kinmundy N BROWN, the attending  physician, personally viewed and interpreted this ECG.   Date: 02/25/2016  EKG Time: 3:10 AM  Rate: 82  Rhythm: Normal sinus rhythm  Axis: Normal  Intervals: Normal  ST&T Change: None   ____________________________________________    RADIOLOGY  DG Chest Portable 1 View (Final result) Result time: 02/25/16 03:41:58   Final result by Rad Results In Interface (02/25/16 03:41:58)   Narrative:   CLINICAL DATA: Awakened at 01:00 with chest pain radiating into the right neck.  EXAM: PORTABLE CHEST 1 VIEW  COMPARISON: None.  FINDINGS: A single AP portable view of the chest demonstrates no focal airspace consolidation or alveolar edema. The lungs are grossly clear. There is no large effusion or pneumothorax. Cardiac and mediastinal contours appear unremarkable.  IMPRESSION: No active disease.   Electronically Signed By: Andreas Newport M.D. On: 02/25/2016 03:41           INITIAL IMPRESSION / ASSESSMENT AND PLAN / ED COURSE  Pertinent labs & imaging results that were available during my care of the patient were reviewed by me and considered in my medical decision making (see chart for details).  ----------------------------------------- 7:32 AM on 02/25/2016 -----------------------------------------  Patient discussed with Dr. Chancy Milroy cardiologist on-call who stated that based on CT angiogram of the chest findings those were negative that the patient could be discharged to his office for outpatient coronary CT versus stress test this morning at 9:30. Patient's care transferred to Dr Corky Downs ____________________________________________   FINAL CLINICAL IMPRESSION(S) / ED DIAGNOSES  Final diagnoses:  Chest pain, unspecified chest pain type      Gregor Hams, MD 02/25/16 [phone]

## 2016-02-25 NOTE — ED Notes (Signed)
Pt transported to CT ?

## 2016-02-25 NOTE — ED Provider Notes (Signed)
Assumed care from Dr. Owens Shark. Asked to follow up on CT scan.   No PE on CT but right upper lobe pneumonia. Although the patient is feeling improved, When I turned off patient's oxygen he desaturates to 88%. Hence I feel he will require hospitalization. Rocephin and azithromycin and blood cultures ordered.  Lavonia Drafts, MD 02/25/16 [phone]

## 2016-02-26 LAB — CBC
HEMATOCRIT: 42.6 % (ref 40.0–52.0)
HEMOGLOBIN: 14.3 g/dL (ref 13.0–18.0)
MCH: 30.5 pg (ref 26.0–34.0)
MCHC: 33.6 g/dL (ref 32.0–36.0)
MCV: 91 fL (ref 80.0–100.0)
Platelets: 177 10*3/uL (ref 150–440)
RBC: 4.69 MIL/uL (ref 4.40–5.90)
RDW: 13.6 % (ref 11.5–14.5)
WBC: 7.2 10*3/uL (ref 3.8–10.6)

## 2016-02-26 LAB — HIV ANTIBODY (ROUTINE TESTING W REFLEX): HIV SCREEN 4TH GENERATION: NONREACTIVE

## 2016-02-26 MED ORDER — AZITHROMYCIN 500 MG PO TABS: 500.0000 mg | ORAL_TABLET | Freq: Every day | ORAL | Status: DC

## 2016-02-26 NOTE — Progress Notes (Signed)
Notified of new-onset Afib.  Patient denies history of Afib.  CHADS2 score 1.  No anticoagulation necessary.  Continue prophylactic Lovenox dosing

## 2016-02-26 NOTE — Discharge Summary (Signed)
[facility] [place] NAME: [name]    MR#:  [id number]  DATE OF BIRTH:  04-Aug-1946  DATE OF ADMISSION:  02/25/2016 ADMITTING PHYSICIAN: Demetrios Loll, MD  DATE OF DISCHARGE: 02/26/2016  PRIMARY CARE PHYSICIAN: Juluis Pitch, MD    ADMISSION DIAGNOSIS:  Community acquired pneumonia [J18.9] Chest pain, unspecified chest pain type [R07.9]   DISCHARGE DIAGNOSIS:  Pneumonia, CAP with hypoxia and the leukocytosis Paroxymal Afib (one episode SECONDARY DIAGNOSIS:   Past Medical History  Diagnosis Date  . Hypertension   . Hyperlipidemia     HOSPITAL COURSE:   Pneumonia, CAP with hypoxia and the leukocytosis  He has been treated with Zithromax and Rocephin, NEB.   Leukocytosis improved. Off oxygen.  Chest pain, possible due to pneumonia. Troponins are negative. Continue aspirin and Pravachol.  Hypertension. Continue lisinopril. Hyperlipidemia. Continue Pravachol.  Paroxymal Afib (one episode last night then back to NSR). Continue ASA. CHADS2 score 1. No anticoagulation necessary. Follow up as outpatient.  DISCHARGE CONDITIONS:   Stable, discharge to home today.  CONSULTS OBTAINED:     DRUG ALLERGIES:   Allergies  Allergen Reactions  . Lipitor [Atorvastatin] Other (See Comments)    Bone and muscle pain.  . Sulfa Antibiotics Hives and Rash    DISCHARGE MEDICATIONS:   Current Discharge Medication List    START taking these medications   Details  azithromycin (ZITHROMAX) 500 MG tablet Take 1 tablet (500 mg total) by mouth daily. Qty: 5 tablet, Refills: 0      CONTINUE these medications which have NOT CHANGED   Details  aspirin EC 81 MG tablet Take 81 mg by mouth daily.    glucosamine-chondroitin 500-400 MG tablet Take 1 tablet by mouth 2 (two) times daily.    Grape Seed Extract 100 MG CAPS Take 1 capsule by mouth 2 (two) times daily.    lisinopril (PRINIVIL,ZESTRIL) 20 MG tablet Take 1 tablet by mouth 2  (two) times daily.  Refills: 0    lovastatin (MEVACOR) 40 MG tablet Take 1 tablet by mouth every evening. Refills: 1    Multiple Vitamin (MULTIVITAMIN WITH MINERALS) TABS tablet Take 1 tablet by mouth daily.    omeprazole (PRILOSEC) 20 MG capsule Take 1 capsule by mouth daily. Refills: 0    sildenafil (REVATIO) 20 MG tablet Take 1 tablet by mouth as needed.         DISCHARGE INSTRUCTIONS:     If you experience worsening of your admission symptoms, develop shortness of breath, life threatening emergency, suicidal or homicidal thoughts you must seek medical attention immediately by calling 911 or calling your MD immediately  if symptoms less severe.  You Must read complete instructions/literature along with all the possible adverse reactions/side effects for all the Medicines you take and that have been prescribed to you. Take any new Medicines after you have completely understood and accept all the possible adverse reactions/side effects.   Please note  You were cared for by a hospitalist during your hospital stay. If you have any questions about your discharge medications or the care you received while you were in the hospital after you are discharged, you can call the unit and asked to speak with the hospitalist on call if the hospitalist that took care of you is not available. Once you are discharged, your primary care physician will handle any further medical issues. Please note that NO REFILLS for any discharge medications will be authorized once you are discharged, as  it is imperative that you return to your primary care physician (or establish a relationship with a primary care physician if you do not have one) for your aftercare needs so that they can reassess your need for medications and monitor your lab values.    Today   SUBJECTIVE   No complaint.   VITAL SIGNS:  Blood pressure 123/72, pulse 101, temperature 97.9 F (36.6 C), temperature source Oral, resp. rate 18,  height 5\' 10"  (1.778 m), weight 99.791 kg (220 lb), SpO2 95 %.  I/O:   Intake/Output Summary (Last 24 hours) at 02/26/16 1035 Last data filed at 02/26/16 0830  Gross per 24 hour  Intake    480 ml  Output      0 ml  Net    480 ml    PHYSICAL EXAMINATION:  GENERAL:  70 y.o.-year-old patient lying in the bed with no acute distress.  EYES: Pupils equal, round, reactive to light and accommodation. No scleral icterus. Extraocular muscles intact.  HEENT: Head atraumatic, normocephalic. Oropharynx and nasopharynx clear.  NECK:  Supple, no jugular venous distention. No thyroid enlargement, no tenderness.  LUNGS: Normal breath sounds bilaterally, no wheezing, rales,rhonchi or crepitation. No use of accessory muscles of respiration.  CARDIOVASCULAR: S1, S2 normal. No murmurs, rubs, or gallops.  ABDOMEN: Soft, non-tender, non-distended. Bowel sounds present. No organomegaly or mass.  EXTREMITIES: No pedal edema, cyanosis, or clubbing.  NEUROLOGIC: Cranial nerves II through XII are intact. Muscle strength 5/5 in all extremities. Sensation intact. Gait not checked.  PSYCHIATRIC: The patient is alert and oriented x 3.  SKIN: No obvious rash, lesion, or ulcer.   DATA REVIEW:   CBC  Recent Labs Lab 02/26/16 0512  WBC 7.2  HGB 14.3  HCT 42.6  PLT 177    Chemistries   Recent Labs Lab 02/25/16 0325  NA 137  K 3.6  CL 105  CO2 28  GLUCOSE 107*  BUN 15  CREATININE 0.99  CALCIUM 8.9    Cardiac Enzymes  Recent Labs Lab 02/25/16 0628  TROPONINI <0.03    Microbiology Results  Results for orders placed or performed in visit on 04/25/12  MRSA PCR Screening     Status: None   Collection Time: 04/25/12  8:24 AM  Result Value Ref Range Status   Micro Text Report   Final       COMMENT                   NEGATIVE - MRSA target DNA not detected   ANTIBIOTIC                                                      Urine culture     Status: None   Collection Time: 04/25/12  8:41 AM   Result Value Ref Range Status   Micro Text Report   Final       SOURCE: clean catch    COMMENT                   NO GROWTH IN 18-24 HOURS   ANTIBIOTIC  RADIOLOGY:  Ct Angio Chest Pe W/cm &/or Wo Cm  02/25/2016  CLINICAL DATA:  70 year old male with right side chest pain since 0100 hours. Shortness of breath. Influenza recently. Initial encounter. EXAM: CT ANGIOGRAPHY CHEST WITH CONTRAST TECHNIQUE: Multidetector CT imaging of the chest was performed using the standard protocol during bolus administration of intravenous contrast. Multiplanar CT image reconstructions and MIPs were obtained to evaluate the vascular anatomy. CONTRAST:  75 mL Isovue 370 COMPARISON:  Portable chest x-ray 0334 hours today. FINDINGS: Adequate contrast bolus timing in the pulmonary arterial tree. Mild respiratory motion artifact at the lung bases. No focal filling defect identified in the pulmonary arterial system to suggest the presence of acute pulmonary embolism. Major airways are patent aside from atelectatic changes. Depending ground-glass opacity in both lungs most compatible with atelectasis. Superimposed peri-bronchovascular nodularity throughout the right upper lobe, with patchy/indistinct small nodules (e.g. Series 6, images 29 and 34). No definite involvement of the right middle or lower lobes. No definite involvement of the left lung. Left lower lobe calcified granuloma on series 6, image 73. No superimposed pleural effusion. Cardiomegaly. No pericardial effusion. Mildly tortuous thoracic aorta. Minimal evidence of aortic and coronary artery calcified atherosclerosis. No mediastinal or hilar lymphadenopathy. Negative thoracic inlet. No axillary lymphadenopathy. Negative visualized liver (small benign inferior right lobe hemangioma or vascular phenomena series 4, image 142), spleen, pancreas, adrenal glands, and kidneys. Small dependent gallstones within the  gallbladder, no pericholecystic inflammation. Diverticulosis at the splenic flexure. Small gastric hiatal hernia. Otherwise negative visualized bowel. No acute osseous abnormality identified. Review of the MIP images confirms the above findings. IMPRESSION: 1.  No evidence of acute pulmonary embolus. 2. Low lung volumes with atelectasis. Superimposed right upper lobe peribronchial nodularity most compatible with acute right upper lobe infection. No pleural effusion. 3. Cardiomegaly.  Cholelithiasis.  Left colon diverticulosis. Electronically Signed   By: Genevie Ann M.D.   On: 02/25/2016 07:42   Dg Chest Portable 1 View  02/25/2016  CLINICAL DATA:  Awakened at 01:00 with chest pain radiating into the right neck. EXAM: PORTABLE CHEST 1 VIEW COMPARISON:  None. FINDINGS: A single AP portable view of the chest demonstrates no focal airspace consolidation or alveolar edema. The lungs are grossly clear. There is no large effusion or pneumothorax. Cardiac and mediastinal contours appear unremarkable. IMPRESSION: No active disease. Electronically Signed   By: Andreas Newport M.D.   On: 02/25/2016 03:41        Management plans discussed with the patient, wife and they are in agreement.  CODE STATUS:     Code Status Orders        Start     Ordered   02/25/16 1002  Full code   Continuous     02/25/16 1001    Code Status History    Date Active Date Inactive Code Status Order ID Comments User Context   This patient has a current code status but no historical code status.    Advance Directive Documentation        Most Recent Value   Type of Advance Directive  Living will   Pre-existing out of facility DNR order (yellow form or pink MOST form)     "MOST" Form in Place?        TOTAL TIME TAKING CARE OF THIS PATIENT: 33 minutes.    Demetrios Loll M.D on 02/26/2016 at 10:35 AM  Between 7am to 6pm - Pager - [phone]  After 6pm go to [email or web address] - [place]  [facility]  Hospitalists  Office  [phone]  CC: Primary care physician; Juluis Pitch, MD

## 2016-02-26 NOTE — Plan of Care (Signed)
Problem: Bowel/Gastric: Goal: Will not experience complications related to bowel motility Outcome: Progressing No c/o chest pain nor respiratory distress noted. Pt's heart rhythm changed to AFib new onset, with hr 160's with activity; MD aware. Heart rhythm turned back to SR this am. Cough syrup times two with relief. Continues with scheduled breathing tx's. Continue to monitor

## 2016-02-26 NOTE — Discharge Instructions (Signed)
Heart healthy diet. °Activity as tolerated. °

## 2016-02-26 NOTE — Progress Notes (Signed)
Patient is discharge in a stable condition, summary and f/u care given verbalized understanding , left with wife

## 2016-03-01 LAB — CULTURE, BLOOD (ROUTINE X 2)
Culture: NO GROWTH
Culture: NO GROWTH

## 2016-03-11 DIAGNOSIS — M109 Gout, unspecified: Secondary | ICD-10-CM | POA: Diagnosis not present

## 2016-03-11 DIAGNOSIS — I10 Essential (primary) hypertension: Secondary | ICD-10-CM | POA: Diagnosis not present

## 2016-03-11 DIAGNOSIS — K219 Gastro-esophageal reflux disease without esophagitis: Secondary | ICD-10-CM | POA: Diagnosis not present

## 2016-03-11 DIAGNOSIS — Z125 Encounter for screening for malignant neoplasm of prostate: Secondary | ICD-10-CM | POA: Diagnosis not present

## 2016-03-11 DIAGNOSIS — E782 Mixed hyperlipidemia: Secondary | ICD-10-CM | POA: Diagnosis not present

## 2016-03-11 DIAGNOSIS — J189 Pneumonia, unspecified organism: Secondary | ICD-10-CM | POA: Diagnosis not present

## 2016-07-15 DIAGNOSIS — Z961 Presence of intraocular lens: Secondary | ICD-10-CM | POA: Diagnosis not present

## 2016-07-29 DIAGNOSIS — L821 Other seborrheic keratosis: Secondary | ICD-10-CM | POA: Diagnosis not present

## 2016-07-29 DIAGNOSIS — D1800 Hemangioma unspecified site: Secondary | ICD-10-CM | POA: Diagnosis not present

## 2016-07-29 DIAGNOSIS — L820 Inflamed seborrheic keratosis: Secondary | ICD-10-CM | POA: Diagnosis not present

## 2016-07-29 DIAGNOSIS — L905 Scar conditions and fibrosis of skin: Secondary | ICD-10-CM | POA: Diagnosis not present

## 2016-07-29 DIAGNOSIS — D229 Melanocytic nevi, unspecified: Secondary | ICD-10-CM | POA: Diagnosis not present

## 2016-07-29 DIAGNOSIS — L812 Freckles: Secondary | ICD-10-CM | POA: Diagnosis not present

## 2016-07-29 DIAGNOSIS — Z1283 Encounter for screening for malignant neoplasm of skin: Secondary | ICD-10-CM | POA: Diagnosis not present

## 2016-07-29 DIAGNOSIS — I8310 Varicose veins of unspecified lower extremity with inflammation: Secondary | ICD-10-CM | POA: Diagnosis not present

## 2016-07-29 DIAGNOSIS — L918 Other hypertrophic disorders of the skin: Secondary | ICD-10-CM | POA: Diagnosis not present

## 2016-07-29 DIAGNOSIS — L578 Other skin changes due to chronic exposure to nonionizing radiation: Secondary | ICD-10-CM | POA: Diagnosis not present

## 2016-07-29 DIAGNOSIS — Z85828 Personal history of other malignant neoplasm of skin: Secondary | ICD-10-CM | POA: Diagnosis not present

## 2016-08-05 DIAGNOSIS — H43812 Vitreous degeneration, left eye: Secondary | ICD-10-CM | POA: Diagnosis not present

## 2016-09-09 DIAGNOSIS — E782 Mixed hyperlipidemia: Secondary | ICD-10-CM | POA: Diagnosis not present

## 2016-09-09 DIAGNOSIS — K219 Gastro-esophageal reflux disease without esophagitis: Secondary | ICD-10-CM | POA: Diagnosis not present

## 2016-09-09 DIAGNOSIS — I10 Essential (primary) hypertension: Secondary | ICD-10-CM | POA: Diagnosis not present

## 2016-09-09 DIAGNOSIS — M109 Gout, unspecified: Secondary | ICD-10-CM | POA: Diagnosis not present

## 2016-09-09 DIAGNOSIS — Z125 Encounter for screening for malignant neoplasm of prostate: Secondary | ICD-10-CM | POA: Diagnosis not present

## 2016-09-18 DIAGNOSIS — E782 Mixed hyperlipidemia: Secondary | ICD-10-CM | POA: Diagnosis not present

## 2016-09-18 DIAGNOSIS — Z0000 Encounter for general adult medical examination without abnormal findings: Secondary | ICD-10-CM | POA: Diagnosis not present

## 2016-09-18 DIAGNOSIS — I10 Essential (primary) hypertension: Secondary | ICD-10-CM | POA: Diagnosis not present

## 2016-09-18 DIAGNOSIS — M1990 Unspecified osteoarthritis, unspecified site: Secondary | ICD-10-CM | POA: Diagnosis not present

## 2016-09-18 DIAGNOSIS — K219 Gastro-esophageal reflux disease without esophagitis: Secondary | ICD-10-CM | POA: Diagnosis not present

## 2016-11-04 DIAGNOSIS — Z1211 Encounter for screening for malignant neoplasm of colon: Secondary | ICD-10-CM | POA: Diagnosis not present

## 2016-11-04 DIAGNOSIS — I10 Essential (primary) hypertension: Secondary | ICD-10-CM | POA: Diagnosis not present

## 2016-11-11 DIAGNOSIS — H35341 Macular cyst, hole, or pseudohole, right eye: Secondary | ICD-10-CM | POA: Diagnosis not present

## 2017-01-19 ENCOUNTER — Encounter: Payer: Self-pay | Admitting: *Deleted

## 2017-01-20 ENCOUNTER — Ambulatory Visit: Payer: PPO | Admitting: Anesthesiology

## 2017-01-20 ENCOUNTER — Encounter: Payer: Self-pay | Admitting: *Deleted

## 2017-01-20 ENCOUNTER — Ambulatory Visit
Admission: RE | Admit: 2017-01-20 | Discharge: 2017-01-20 | Disposition: A | Discharge status: Home / Self Care | Payer: PPO | Source: Ambulatory Visit | Attending: Unknown Physician Specialty | Admitting: Unknown Physician Specialty

## 2017-01-20 ENCOUNTER — Encounter
Admission: RE | Disposition: A | Discharge status: Home / Self Care | Payer: Self-pay | Source: Ambulatory Visit | Attending: Unknown Physician Specialty

## 2017-01-20 DIAGNOSIS — I129 Hypertensive chronic kidney disease with stage 1 through stage 4 chronic kidney disease, or unspecified chronic kidney disease: Secondary | ICD-10-CM | POA: Diagnosis not present

## 2017-01-20 DIAGNOSIS — K5730 Diverticulosis of large intestine without perforation or abscess without bleeding: Secondary | ICD-10-CM | POA: Diagnosis not present

## 2017-01-20 DIAGNOSIS — Z7982 Long term (current) use of aspirin: Secondary | ICD-10-CM | POA: Insufficient documentation

## 2017-01-20 DIAGNOSIS — E785 Hyperlipidemia, unspecified: Secondary | ICD-10-CM | POA: Insufficient documentation

## 2017-01-20 DIAGNOSIS — K5790 Diverticulosis of intestine, part unspecified, without perforation or abscess without bleeding: Secondary | ICD-10-CM | POA: Diagnosis not present

## 2017-01-20 DIAGNOSIS — N189 Chronic kidney disease, unspecified: Secondary | ICD-10-CM | POA: Insufficient documentation

## 2017-01-20 DIAGNOSIS — K649 Unspecified hemorrhoids: Secondary | ICD-10-CM | POA: Diagnosis not present

## 2017-01-20 DIAGNOSIS — Z1211 Encounter for screening for malignant neoplasm of colon: Secondary | ICD-10-CM | POA: Diagnosis not present

## 2017-01-20 DIAGNOSIS — K640 First degree hemorrhoids: Secondary | ICD-10-CM | POA: Insufficient documentation

## 2017-01-20 DIAGNOSIS — K219 Gastro-esophageal reflux disease without esophagitis: Secondary | ICD-10-CM | POA: Diagnosis not present

## 2017-01-20 HISTORY — DX: Gout, unspecified: M10.9

## 2017-01-20 HISTORY — DX: Unspecified osteoarthritis, unspecified site: M19.90

## 2017-01-20 HISTORY — PX: COLONOSCOPY WITH PROPOFOL: SHX5780

## 2017-01-20 HISTORY — DX: Gastro-esophageal reflux disease without esophagitis: K21.9

## 2017-01-20 HISTORY — DX: Chronic kidney disease, unspecified: N18.9

## 2017-01-20 SURGERY — COLONOSCOPY WITH PROPOFOL
Anesthesia: General

## 2017-01-20 MED ORDER — PIPERACILLIN-TAZOBACTAM 3.375 G IVPB 30 MIN
3.3750 g | Freq: Once | INTRAVENOUS | Status: AC
  Administered 2017-01-20: 3.375 g via INTRAVENOUS
  Filled 2017-01-20: qty 50

## 2017-01-20 MED ORDER — SODIUM CHLORIDE 0.9 % IV SOLN: INTRAVENOUS | Status: DC

## 2017-01-20 MED ORDER — PROPOFOL 500 MG/50ML IV EMUL
INTRAVENOUS | Status: DC | PRN
  Administered 2017-01-20: 100 ug/kg/min via INTRAVENOUS

## 2017-01-20 MED ORDER — SODIUM CHLORIDE 0.9 % IV SOLN
INTRAVENOUS | Status: DC
  Administered 2017-01-20: 09:00:00 via INTRAVENOUS
  Administered 2017-01-20: 1000 mL via INTRAVENOUS

## 2017-01-20 MED ORDER — PROPOFOL 500 MG/50ML IV EMUL
INTRAVENOUS | Status: AC
  Filled 2017-01-20: qty 50

## 2017-01-20 NOTE — Anesthesia Preprocedure Evaluation (Signed)
Anesthesia Evaluation  Patient identified by MRN, date of birth, ID band Patient awake    Reviewed: Allergy & Precautions, NPO status , Patient's Chart, lab work & pertinent test results  History of Anesthesia Complications Negative for: history of anesthetic complications  Airway Mallampati: III  TM Distance: >3 FB Neck ROM: Full    Dental no notable dental hx.    Pulmonary neg pulmonary ROS, neg sleep apnea, neg COPD,    breath sounds clear to auscultation- rhonchi (-) wheezing      Cardiovascular Exercise Tolerance: Good hypertension, Pt. on medications (-) CAD and (-) Past MI  Rhythm:Regular Rate:Normal - Systolic murmurs and - Diastolic murmurs    Neuro/Psych negative neurological ROS  negative psych ROS   GI/Hepatic Neg liver ROS, GERD  ,  Endo/Other  negative endocrine ROSneg diabetes  Renal/GU Renal disease: hx of nephrolithiasis.     Musculoskeletal  (+) Arthritis ,   Abdominal (+) + obese,   Peds  Hematology negative hematology ROS (+)   Anesthesia Other Findings Past Medical History: No date: Arthritis No date: Chronic kidney disease No date: GERD (gastroesophageal reflux disease) No date: Gout No date: Hyperlipidemia No date: Hypertension   Reproductive/Obstetrics                             Anesthesia Physical Anesthesia Plan  ASA: II  Anesthesia Plan: General   Post-op Pain Management:    Induction: Intravenous  Airway Management Planned: Natural Airway  Additional Equipment:   Intra-op Plan:   Post-operative Plan:   Informed Consent: I have reviewed the patients History and Physical, chart, labs and discussed the procedure including the risks, benefits and alternatives for the proposed anesthesia with the patient or authorized representative who has indicated his/her understanding and acceptance.   Dental advisory given  Plan Discussed with: CRNA and  Anesthesiologist  Anesthesia Plan Comments:         Anesthesia Quick Evaluation

## 2017-01-20 NOTE — Transfer of Care (Signed)
Immediate Anesthesia Transfer of Care Note  Patient: [name]  Procedure(s) Performed: Procedure(s): COLONOSCOPY WITH PROPOFOL (N/A)  Patient Location: PACU  Anesthesia Type:General  Level of Consciousness: awake, alert  and oriented  Airway & Oxygen Therapy: Patient Spontanous Breathing and Patient connected to nasal cannula oxygen  Post-op Assessment: Report given to RN and Post -op Vital signs reviewed and stable  Post vital signs: Reviewed and stable  Last Vitals:  Vitals:   01/20/17 0755  BP: (!) 143/79  Pulse: 91  Resp: 20  Temp: 36.7 C    Last Pain:  Vitals:   01/20/17 0755  TempSrc: Tympanic         Complications: No apparent anesthesia complications

## 2017-01-20 NOTE — H&P (Signed)
Primary Care Physician:  Juluis Pitch, MD Primary Gastroenterologist:  Dr. Vira Agar  Pre-Procedure History & Physical: HPI:  [name] is a 71 y.o. male is here for an colonoscopy.   Past Medical History:  Diagnosis Date  . Arthritis   . Chronic kidney disease   . GERD (gastroesophageal reflux disease)   . Gout   . Hyperlipidemia   . Hypertension     Past Surgical History:  Procedure Laterality Date  . CATARACT EXTRACTION    . COLONOSCOPY WITH ESOPHAGOGASTRODUODENOSCOPY (EGD)    . REPLACEMENT TOTAL KNEE Right     Prior to Admission medications   Medication Sig Start Date End Date Taking? Authorizing Provider  amoxicillin (AMOXIL) 500 MG capsule Take 500 mg by mouth 3 (three) times daily.   Yes Historical Provider, MD  aspirin EC 81 MG tablet Take 81 mg by mouth daily.   Yes Historical Provider, MD  glucosamine-chondroitin 500-400 MG tablet Take 1 tablet by mouth 2 (two) times daily.   Yes Historical Provider, MD  Grape Seed Extract 100 MG CAPS Take 1 capsule by mouth 2 (two) times daily.   Yes Historical Provider, MD  ibuprofen (ADVIL,MOTRIN) 200 MG tablet Take 200 mg by mouth every 6 (six) hours as needed.   Yes Historical Provider, MD  lisinopril (PRINIVIL,ZESTRIL) 20 MG tablet Take 1 tablet by mouth 2 (two) times daily.  02/10/16  Yes Historical Provider, MD  lovastatin (MEVACOR) 40 MG tablet Take 1 tablet by mouth every evening. 02/18/16  Yes Historical Provider, MD  Multiple Vitamin (MULTIVITAMIN WITH MINERALS) TABS tablet Take 1 tablet by mouth daily.   Yes Historical Provider, MD  omeprazole (PRILOSEC) 20 MG capsule Take 1 capsule by mouth daily. 02/05/16  Yes Historical Provider, MD  sildenafil (REVATIO) 20 MG tablet Take 1 tablet by mouth as needed. 01/31/16  Yes Historical Provider, MD  azithromycin (ZITHROMAX) 500 MG tablet Take 1 tablet (500 mg total) by mouth daily. Patient not taking: Reported on 01/20/2017 02/26/16   Demetrios Loll, MD    Allergies as of  12/21/2016 - Review Complete 02/25/2016  Allergen Reaction Noted  . Lipitor [atorvastatin] Other (See Comments) 02/25/2016  . Sulfa antibiotics Hives and Rash 02/25/2016    Family History  Problem Relation Age of Onset  . Cancer Mother   . Liver cancer Father   . Diabetes Father     Social History   Social History  . Marital status: Married    Spouse name: N/A  . Number of children: N/A  . Years of education: N/A   Occupational History  . Not on file.   Social History Main Topics  . Smoking status: Never Smoker  . Smokeless tobacco: Never Used  . Alcohol use No  . Drug use: No  . Sexual activity: Not on file   Other Topics Concern  . Not on file   Social History Narrative  . No narrative on file    Review of Systems: See HPI, otherwise negative ROS  Physical Exam: BP (!) 143/79   Pulse 91   Temp 98 F (36.7 C) (Tympanic)   Resp 20   Ht 5' 9.5" (1.765 m)   Wt 99.8 kg (220 lb)   SpO2 98%   BMI 32.02 kg/m  General:   Alert,  pleasant and cooperative in NAD Head:  Normocephalic and atraumatic. Neck:  Supple; no masses or thyromegaly. Lungs:  Clear throughout to auscultation.    Heart:  Regular rate and rhythm. Abdomen:  Soft, nontender and nondistended. Normal bowel sounds, without guarding, and without rebound.   Neurologic:  Alert and  oriented x4;  grossly normal neurologically.  Impression/Plan: [name] is here for an colonoscopy to be performed for screening.  Risks, benefits, limitations, and alternatives regarding  colonoscopy have been reviewed with the patient.  Questions have been answered.  All parties agreeable.   Gaylyn Cheers, MD  01/20/2017, 8:51 AM

## 2017-01-20 NOTE — Op Note (Signed)
[facility] Patient Name: Jaylun Fleener Procedure Date: 01/20/2017 8:34 AM MRN: [id number] Account #: [id number] Date of Birth: 10-02-1946 Admit Type: Outpatient Age: 71 Room: [facility] ENDO ROOM 4 Gender: Male Note Status: Finalized Procedure:            Colonoscopy Indications:          Screening for colorectal malignant neoplasm Providers:            Manya Silvas, MD Referring MD:         Youlanda Roys. Lovie Macadamia, MD (Referring MD) Medicines:            Propofol per Anesthesia Complications:        No immediate complications. Procedure:            Pre-Anesthesia Assessment:                       - After reviewing the risks and benefits, the patient                        was deemed in satisfactory condition to undergo the                        procedure.                       After obtaining informed consent, the colonoscope was                        passed under direct vision. Throughout the procedure,                        the patient's blood pressure, pulse, and oxygen                        saturations were monitored continuously. The                        Colonoscope was introduced through the anus and                        advanced to the the cecum, identified by appendiceal                        orifice and ileocecal valve. The colonoscopy was                        performed without difficulty. The patient tolerated the                        procedure well. The quality of the bowel preparation                        was excellent. Findings:      Multiple small-and medium mouthed diverticula were found in the sigmoid       colon and descending colon.      Internal hemorrhoids were found during endoscopy. The hemorrhoids were       small, medium-sized and Grade I (internal hemorrhoids that do not       prolapse).      The exam was otherwise without abnormality. Impression:           -  Diverticulosis in the sigmoid colon and in the                      descending colon.                       - Internal hemorrhoids.                       - The examination was otherwise normal.                       - No specimens collected. Recommendation:       - Repeat colonoscopy in 10 years for screening purposes. Manya Silvas, MD 01/20/2017 9:09:55 AM This report has been signed electronically. Number of Addenda: 0 Note Initiated On: 01/20/2017 8:34 AM Scope Withdrawal Time: 0 hours 6 minutes 33 seconds  Total Procedure Duration: 0 hours 10 minutes 54 seconds       [facility]

## 2017-01-20 NOTE — Anesthesia Postprocedure Evaluation (Signed)
Anesthesia Post Note  Patient: DEVEION DENZ  Procedure(s) Performed: Procedure(s) (LRB): COLONOSCOPY WITH PROPOFOL (N/A)  Patient location during evaluation: Endoscopy Anesthesia Type: General Level of consciousness: awake and alert and oriented Pain management: pain level controlled Vital Signs Assessment: post-procedure vital signs reviewed and stable Respiratory status: spontaneous breathing, nonlabored ventilation and respiratory function stable Cardiovascular status: blood pressure returned to baseline and stable Postop Assessment: no signs of nausea or vomiting Anesthetic complications: no     Last Vitals:  Vitals:   01/20/17 0925 01/20/17 0935  BP: 93/77 102/83  Pulse: 73 79  Resp: (!) 7 18  Temp:      Last Pain:  Vitals:   01/20/17 0915  TempSrc: Tympanic                 Cortland Crehan

## 2017-01-20 NOTE — Anesthesia Post-op Follow-up Note (Cosign Needed)
Anesthesia QCDR form completed.        

## 2017-01-21 ENCOUNTER — Encounter: Payer: Self-pay | Admitting: Unknown Physician Specialty

## 2017-03-19 DIAGNOSIS — E782 Mixed hyperlipidemia: Secondary | ICD-10-CM | POA: Diagnosis not present

## 2017-03-19 DIAGNOSIS — M109 Gout, unspecified: Secondary | ICD-10-CM | POA: Diagnosis not present

## 2017-03-19 DIAGNOSIS — M1990 Unspecified osteoarthritis, unspecified site: Secondary | ICD-10-CM | POA: Diagnosis not present

## 2017-03-19 DIAGNOSIS — I10 Essential (primary) hypertension: Secondary | ICD-10-CM | POA: Diagnosis not present

## 2017-07-07 DIAGNOSIS — H25813 Combined forms of age-related cataract, bilateral: Secondary | ICD-10-CM | POA: Diagnosis not present

## 2017-08-04 DIAGNOSIS — D1800 Hemangioma unspecified site: Secondary | ICD-10-CM | POA: Diagnosis not present

## 2017-08-04 DIAGNOSIS — L905 Scar conditions and fibrosis of skin: Secondary | ICD-10-CM | POA: Diagnosis not present

## 2017-08-04 DIAGNOSIS — L578 Other skin changes due to chronic exposure to nonionizing radiation: Secondary | ICD-10-CM | POA: Diagnosis not present

## 2017-08-04 DIAGNOSIS — L820 Inflamed seborrheic keratosis: Secondary | ICD-10-CM | POA: Diagnosis not present

## 2017-08-04 DIAGNOSIS — L570 Actinic keratosis: Secondary | ICD-10-CM | POA: Diagnosis not present

## 2017-08-04 DIAGNOSIS — Z85828 Personal history of other malignant neoplasm of skin: Secondary | ICD-10-CM | POA: Diagnosis not present

## 2017-08-04 DIAGNOSIS — I8393 Asymptomatic varicose veins of bilateral lower extremities: Secondary | ICD-10-CM | POA: Diagnosis not present

## 2017-08-04 DIAGNOSIS — L821 Other seborrheic keratosis: Secondary | ICD-10-CM | POA: Diagnosis not present

## 2017-08-04 DIAGNOSIS — L918 Other hypertrophic disorders of the skin: Secondary | ICD-10-CM | POA: Diagnosis not present

## 2017-09-24 DIAGNOSIS — E782 Mixed hyperlipidemia: Secondary | ICD-10-CM | POA: Diagnosis not present

## 2017-09-24 DIAGNOSIS — I10 Essential (primary) hypertension: Secondary | ICD-10-CM | POA: Diagnosis not present

## 2017-09-24 DIAGNOSIS — Z125 Encounter for screening for malignant neoplasm of prostate: Secondary | ICD-10-CM | POA: Diagnosis not present

## 2017-09-29 DIAGNOSIS — K219 Gastro-esophageal reflux disease without esophagitis: Secondary | ICD-10-CM | POA: Diagnosis not present

## 2017-09-29 DIAGNOSIS — Z0000 Encounter for general adult medical examination without abnormal findings: Secondary | ICD-10-CM | POA: Diagnosis not present

## 2017-09-29 DIAGNOSIS — Z23 Encounter for immunization: Secondary | ICD-10-CM | POA: Diagnosis not present

## 2017-09-29 DIAGNOSIS — M109 Gout, unspecified: Secondary | ICD-10-CM | POA: Diagnosis not present

## 2017-09-29 DIAGNOSIS — M1990 Unspecified osteoarthritis, unspecified site: Secondary | ICD-10-CM | POA: Diagnosis not present

## 2017-09-29 DIAGNOSIS — E782 Mixed hyperlipidemia: Secondary | ICD-10-CM | POA: Diagnosis not present

## 2017-09-29 DIAGNOSIS — I10 Essential (primary) hypertension: Secondary | ICD-10-CM | POA: Diagnosis not present

## 2018-03-17 DIAGNOSIS — M7062 Trochanteric bursitis, left hip: Secondary | ICD-10-CM | POA: Diagnosis not present

## 2018-03-17 DIAGNOSIS — M25552 Pain in left hip: Secondary | ICD-10-CM | POA: Diagnosis not present

## 2018-03-25 DIAGNOSIS — I10 Essential (primary) hypertension: Secondary | ICD-10-CM | POA: Diagnosis not present

## 2018-03-30 DIAGNOSIS — I10 Essential (primary) hypertension: Secondary | ICD-10-CM | POA: Diagnosis not present

## 2018-03-30 DIAGNOSIS — M1990 Unspecified osteoarthritis, unspecified site: Secondary | ICD-10-CM | POA: Diagnosis not present

## 2018-03-30 DIAGNOSIS — K219 Gastro-esophageal reflux disease without esophagitis: Secondary | ICD-10-CM | POA: Diagnosis not present

## 2018-04-14 DIAGNOSIS — B9689 Other specified bacterial agents as the cause of diseases classified elsewhere: Secondary | ICD-10-CM | POA: Diagnosis not present

## 2018-04-14 DIAGNOSIS — J019 Acute sinusitis, unspecified: Secondary | ICD-10-CM | POA: Diagnosis not present

## 2018-04-14 DIAGNOSIS — I509 Heart failure, unspecified: Secondary | ICD-10-CM | POA: Diagnosis not present

## 2018-04-14 DIAGNOSIS — R05 Cough: Secondary | ICD-10-CM | POA: Diagnosis not present

## 2018-04-14 DIAGNOSIS — R509 Fever, unspecified: Secondary | ICD-10-CM | POA: Diagnosis not present

## 2018-04-27 DIAGNOSIS — R05 Cough: Secondary | ICD-10-CM | POA: Diagnosis not present

## 2018-04-27 DIAGNOSIS — I10 Essential (primary) hypertension: Secondary | ICD-10-CM | POA: Diagnosis not present

## 2018-06-01 DIAGNOSIS — M79671 Pain in right foot: Secondary | ICD-10-CM | POA: Diagnosis not present

## 2018-06-01 DIAGNOSIS — M19071 Primary osteoarthritis, right ankle and foot: Secondary | ICD-10-CM | POA: Diagnosis not present

## 2018-06-01 DIAGNOSIS — M19072 Primary osteoarthritis, left ankle and foot: Secondary | ICD-10-CM | POA: Diagnosis not present

## 2018-06-01 DIAGNOSIS — M79672 Pain in left foot: Secondary | ICD-10-CM | POA: Diagnosis not present

## 2018-07-01 DIAGNOSIS — R0602 Shortness of breath: Secondary | ICD-10-CM | POA: Diagnosis not present

## 2018-07-20 DIAGNOSIS — R0602 Shortness of breath: Secondary | ICD-10-CM | POA: Diagnosis not present

## 2018-08-24 DIAGNOSIS — L820 Inflamed seborrheic keratosis: Secondary | ICD-10-CM | POA: Diagnosis not present

## 2018-08-24 DIAGNOSIS — Z85828 Personal history of other malignant neoplasm of skin: Secondary | ICD-10-CM | POA: Diagnosis not present

## 2018-08-24 DIAGNOSIS — Z1283 Encounter for screening for malignant neoplasm of skin: Secondary | ICD-10-CM | POA: Diagnosis not present

## 2018-08-24 DIAGNOSIS — D1800 Hemangioma unspecified site: Secondary | ICD-10-CM | POA: Diagnosis not present

## 2018-08-24 DIAGNOSIS — L821 Other seborrheic keratosis: Secondary | ICD-10-CM | POA: Diagnosis not present

## 2018-08-24 DIAGNOSIS — D229 Melanocytic nevi, unspecified: Secondary | ICD-10-CM | POA: Diagnosis not present

## 2018-08-24 DIAGNOSIS — I8393 Asymptomatic varicose veins of bilateral lower extremities: Secondary | ICD-10-CM | POA: Diagnosis not present

## 2018-08-24 DIAGNOSIS — L570 Actinic keratosis: Secondary | ICD-10-CM | POA: Diagnosis not present

## 2018-08-24 DIAGNOSIS — D225 Melanocytic nevi of trunk: Secondary | ICD-10-CM | POA: Diagnosis not present

## 2018-08-24 DIAGNOSIS — L918 Other hypertrophic disorders of the skin: Secondary | ICD-10-CM | POA: Diagnosis not present

## 2018-09-23 DIAGNOSIS — Z125 Encounter for screening for malignant neoplasm of prostate: Secondary | ICD-10-CM | POA: Diagnosis not present

## 2018-09-23 DIAGNOSIS — I10 Essential (primary) hypertension: Secondary | ICD-10-CM | POA: Diagnosis not present

## 2018-09-23 DIAGNOSIS — E782 Mixed hyperlipidemia: Secondary | ICD-10-CM | POA: Diagnosis not present

## 2018-09-30 DIAGNOSIS — K219 Gastro-esophageal reflux disease without esophagitis: Secondary | ICD-10-CM | POA: Diagnosis not present

## 2018-09-30 DIAGNOSIS — E782 Mixed hyperlipidemia: Secondary | ICD-10-CM | POA: Diagnosis not present

## 2018-09-30 DIAGNOSIS — M1990 Unspecified osteoarthritis, unspecified site: Secondary | ICD-10-CM | POA: Diagnosis not present

## 2018-09-30 DIAGNOSIS — I10 Essential (primary) hypertension: Secondary | ICD-10-CM | POA: Diagnosis not present

## 2018-09-30 DIAGNOSIS — Z0000 Encounter for general adult medical examination without abnormal findings: Secondary | ICD-10-CM | POA: Diagnosis not present

## 2018-12-14 DIAGNOSIS — Z961 Presence of intraocular lens: Secondary | ICD-10-CM | POA: Diagnosis not present

## 2019-03-31 DIAGNOSIS — E782 Mixed hyperlipidemia: Secondary | ICD-10-CM | POA: Diagnosis not present

## 2019-03-31 DIAGNOSIS — K219 Gastro-esophageal reflux disease without esophagitis: Secondary | ICD-10-CM | POA: Diagnosis not present

## 2019-03-31 DIAGNOSIS — M109 Gout, unspecified: Secondary | ICD-10-CM | POA: Diagnosis not present

## 2019-03-31 DIAGNOSIS — I10 Essential (primary) hypertension: Secondary | ICD-10-CM | POA: Diagnosis not present

## 2019-09-13 DIAGNOSIS — L821 Other seborrheic keratosis: Secondary | ICD-10-CM | POA: Diagnosis not present

## 2019-09-13 DIAGNOSIS — L918 Other hypertrophic disorders of the skin: Secondary | ICD-10-CM | POA: Diagnosis not present

## 2019-09-13 DIAGNOSIS — L814 Other melanin hyperpigmentation: Secondary | ICD-10-CM | POA: Diagnosis not present

## 2019-09-13 DIAGNOSIS — D225 Melanocytic nevi of trunk: Secondary | ICD-10-CM | POA: Diagnosis not present

## 2019-09-13 DIAGNOSIS — L905 Scar conditions and fibrosis of skin: Secondary | ICD-10-CM | POA: Diagnosis not present

## 2019-09-13 DIAGNOSIS — L820 Inflamed seborrheic keratosis: Secondary | ICD-10-CM | POA: Diagnosis not present

## 2019-09-13 DIAGNOSIS — I8393 Asymptomatic varicose veins of bilateral lower extremities: Secondary | ICD-10-CM | POA: Diagnosis not present

## 2019-09-13 DIAGNOSIS — D2230 Melanocytic nevi of unspecified part of face: Secondary | ICD-10-CM | POA: Diagnosis not present

## 2019-09-13 DIAGNOSIS — D229 Melanocytic nevi, unspecified: Secondary | ICD-10-CM | POA: Diagnosis not present

## 2019-09-13 DIAGNOSIS — Z86007 Personal history of in-situ neoplasm of skin: Secondary | ICD-10-CM | POA: Diagnosis not present

## 2019-09-13 DIAGNOSIS — L578 Other skin changes due to chronic exposure to nonionizing radiation: Secondary | ICD-10-CM | POA: Diagnosis not present

## 2019-09-13 DIAGNOSIS — D1800 Hemangioma unspecified site: Secondary | ICD-10-CM | POA: Diagnosis not present

## 2019-10-20 DIAGNOSIS — K219 Gastro-esophageal reflux disease without esophagitis: Secondary | ICD-10-CM | POA: Diagnosis not present

## 2019-10-20 DIAGNOSIS — I10 Essential (primary) hypertension: Secondary | ICD-10-CM | POA: Diagnosis not present

## 2019-10-20 DIAGNOSIS — M109 Gout, unspecified: Secondary | ICD-10-CM | POA: Diagnosis not present

## 2019-10-20 DIAGNOSIS — Z125 Encounter for screening for malignant neoplasm of prostate: Secondary | ICD-10-CM | POA: Diagnosis not present

## 2019-10-20 DIAGNOSIS — E782 Mixed hyperlipidemia: Secondary | ICD-10-CM | POA: Diagnosis not present

## 2019-10-27 DIAGNOSIS — Z0000 Encounter for general adult medical examination without abnormal findings: Secondary | ICD-10-CM | POA: Diagnosis not present

## 2019-10-27 DIAGNOSIS — E782 Mixed hyperlipidemia: Secondary | ICD-10-CM | POA: Diagnosis not present

## 2019-10-27 DIAGNOSIS — I10 Essential (primary) hypertension: Secondary | ICD-10-CM | POA: Diagnosis not present

## 2019-10-27 DIAGNOSIS — K219 Gastro-esophageal reflux disease without esophagitis: Secondary | ICD-10-CM | POA: Diagnosis not present

## 2019-10-27 DIAGNOSIS — M1990 Unspecified osteoarthritis, unspecified site: Secondary | ICD-10-CM | POA: Diagnosis not present

## 2019-10-27 DIAGNOSIS — M109 Gout, unspecified: Secondary | ICD-10-CM | POA: Diagnosis not present

## 2020-01-24 DIAGNOSIS — Z961 Presence of intraocular lens: Secondary | ICD-10-CM | POA: Diagnosis not present

## 2020-04-26 DIAGNOSIS — M109 Gout, unspecified: Secondary | ICD-10-CM | POA: Diagnosis not present

## 2020-04-26 DIAGNOSIS — K219 Gastro-esophageal reflux disease without esophagitis: Secondary | ICD-10-CM | POA: Diagnosis not present

## 2020-04-26 DIAGNOSIS — E782 Mixed hyperlipidemia: Secondary | ICD-10-CM | POA: Diagnosis not present

## 2020-04-26 DIAGNOSIS — I10 Essential (primary) hypertension: Secondary | ICD-10-CM | POA: Diagnosis not present

## 2020-05-17 DIAGNOSIS — K219 Gastro-esophageal reflux disease without esophagitis: Secondary | ICD-10-CM | POA: Diagnosis not present

## 2020-09-18 ENCOUNTER — Ambulatory Visit: Payer: PPO | Admitting: Dermatology

## 2020-09-18 ENCOUNTER — Other Ambulatory Visit: Payer: Self-pay

## 2020-09-18 ENCOUNTER — Encounter: Payer: Self-pay | Admitting: Dermatology

## 2020-09-18 DIAGNOSIS — L858 Other specified epidermal thickening: Secondary | ICD-10-CM | POA: Diagnosis not present

## 2020-09-18 DIAGNOSIS — D492 Neoplasm of unspecified behavior of bone, soft tissue, and skin: Secondary | ICD-10-CM

## 2020-09-18 DIAGNOSIS — L821 Other seborrheic keratosis: Secondary | ICD-10-CM

## 2020-09-18 DIAGNOSIS — Z86007 Personal history of in-situ neoplasm of skin: Secondary | ICD-10-CM

## 2020-09-18 DIAGNOSIS — L820 Inflamed seborrheic keratosis: Secondary | ICD-10-CM

## 2020-09-18 DIAGNOSIS — L814 Other melanin hyperpigmentation: Secondary | ICD-10-CM | POA: Diagnosis not present

## 2020-09-18 DIAGNOSIS — I8393 Asymptomatic varicose veins of bilateral lower extremities: Secondary | ICD-10-CM | POA: Diagnosis not present

## 2020-09-18 DIAGNOSIS — Z1283 Encounter for screening for malignant neoplasm of skin: Secondary | ICD-10-CM | POA: Diagnosis not present

## 2020-09-18 DIAGNOSIS — L578 Other skin changes due to chronic exposure to nonionizing radiation: Secondary | ICD-10-CM

## 2020-09-18 DIAGNOSIS — D485 Neoplasm of uncertain behavior of skin: Secondary | ICD-10-CM

## 2020-09-18 DIAGNOSIS — D1800 Hemangioma unspecified site: Secondary | ICD-10-CM

## 2020-09-18 DIAGNOSIS — D229 Melanocytic nevi, unspecified: Secondary | ICD-10-CM | POA: Diagnosis not present

## 2020-09-18 DIAGNOSIS — Z85828 Personal history of other malignant neoplasm of skin: Secondary | ICD-10-CM

## 2020-09-18 NOTE — Patient Instructions (Signed)

## 2020-09-18 NOTE — Progress Notes (Signed)
Follow-Up Visit   Subjective  [name] is a 74 y.o. 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 The patient presents for Total-Body Skin Exam (TBSE) for skin cancer screening and mole check.  The following portions of the chart were reviewed this encounter and updated as appropriate:  Tobacco  Allergies  Meds  Problems  Med Hx  Surg Hx  Fam Hx     Review of Systems:  No other skin or systemic complaints except as noted in HPI or Assessment and Plan.  Objective  Well appearing patient in no apparent distress; mood and affect are within normal limits.  A full examination was performed including scalp, head, eyes, ears, nose, lips, neck, chest, axillae, abdomen, back, buttocks, bilateral upper extremities, bilateral lower extremities, hands, feet, fingers, toes, fingernails, and toenails. All findings within normal limits unless otherwise noted below.  Objective  L cheek x 1, R tricep x 1 (2): Erythematous keratotic or waxy stuck-on papule or plaque.   Objective  L ant neck: Pink pap 0.8cm   Assessment & Plan    Lentigines - Scattered tan macules - Discussed due to sun exposure - Benign, observe - Call for any changes  Seborrheic Keratoses - Stuck-on, waxy, tan-brown papules and plaques  - Discussed benign etiology and prognosis. - Observe - Call for any changes  Melanocytic Nevi - Tan-brown and/or pink-flesh-colored symmetric macules and papules - Benign appearing on exam today - Observation - Call clinic for new or changing moles - Recommend daily use of broad spectrum spf 30+ sunscreen to sun-exposed areas.   Hemangiomas - Red papules - Discussed benign nature - Observe - Call for any changes  Actinic Damage - chronic, secondary to cumulative UV exposure - diffuse scaly erythematous macules with underlying dyspigmentation - Recommend  daily broad spectrum sunscreen SPF 30+ to sun-exposed areas, reapply every 2 hours as needed.  - Call for new or changing lesions.  Skin cancer screening performed today.  History of Melanoma in Situ - No evidence of recurrence today - No lymphadenopathy - Recommend regular full body skin exams - Recommend daily broad spectrum sunscreen SPF 30+ to sun-exposed areas, reapply every 2 hours as needed.  - Call if any new or changing lesions are noted between office visits - Anterior neck  Inflamed seborrheic keratosis (2) L cheek x 1, R tricep x 1  Destruction of lesion - L cheek x 1, R tricep x 1 Complexity: simple   Destruction method: cryotherapy   Informed consent: discussed and consent obtained   Timeout:  patient name, date of birth, surgical site, and procedure verified Lesion destroyed using liquid nitrogen: Yes   Region frozen until ice ball extended beyond lesion: Yes   Outcome: patient tolerated procedure well with no complications   Post-procedure details: wound care instructions given    Neoplasm of skin L ant neck  Epidermal / dermal shaving  Lesion diameter (cm):  0.8 Informed consent: discussed and consent obtained   Timeout: patient name, date of birth, surgical site, and procedure verified   Procedure prep:  Patient was prepped and draped in usual sterile fashion Prep type:  Isopropyl alcohol Anesthesia: the lesion was anesthetized in a standard fashion   Anesthetic:  1% lidocaine w/ epinephrine 1-100,000 buffered w/ 8.4% NaHCO3 Instrument used: flexible razor blade   Hemostasis achieved with: pressure, aluminum chloride and electrodesiccation  Outcome: patient tolerated procedure well   Post-procedure details: sterile dressing applied and wound care instructions given   Dressing type: bandage and bacitracin    Destruction of lesion Complexity: extensive   Destruction method: electrodesiccation and curettage   Informed consent: discussed and consent  obtained   Timeout:  patient name, date of birth, surgical site, and procedure verified Procedure prep:  Patient was prepped and draped in usual sterile fashion Prep type:  Isopropyl alcohol Anesthesia: the lesion was anesthetized in a standard fashion   Anesthetic:  1% lidocaine w/ epinephrine 1-100,000 buffered w/ 8.4% NaHCO3 Curettage performed in three different directions: Yes   Electrodesiccation performed over the curetted area: Yes   Lesion length (cm):  0.8 Lesion width (cm):  0.8 Margin per side (cm):  0.2 Final wound size (cm):  1.2 Hemostasis achieved with:  pressure, aluminum chloride and electrodesiccation Outcome: patient tolerated procedure well with no complications   Post-procedure details: sterile dressing applied and wound care instructions given   Dressing type: bandage and bacitracin    Specimen 1 - Surgical pathology Differential Diagnosis: D48.5 BCC vs other Check Margins: No Pink pap 0.8cm EDC today  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  Recommend daily graduated compression hose/stockings- easiest to put on first thing in morning, remove at bedtime.  Elevate legs as much as possible. Avoid salt/sodium rich foods.   Return in about 1 year (around 09/18/2021) for TBSE hx of SCCIS.   I, Othelia Pulling, RMA, am acting as scribe for Sarina Ser, MD .  Documentation: I have reviewed the above documentation for accuracy and completeness, and I agree with the above.  Sarina Ser, MD

## 2020-09-23 ENCOUNTER — Telehealth: Payer: Self-pay

## 2020-09-23 NOTE — Telephone Encounter (Signed)
-----   Message from Ralene Bathe, MD sent at 09/20/2020  5:34 PM EDT ----- Diagnosis Skin , left ant neck FOCAL ACANTHOLYTIC DYSKERATOSIS, SEE DESCRIPTION  Benign acantholytic acanthoma No further treatment needed Recheck next visit

## 2020-09-23 NOTE — Telephone Encounter (Signed)
Advised patient of results/hd  

## 2020-10-23 ENCOUNTER — Other Ambulatory Visit: Payer: Self-pay

## 2020-10-23 ENCOUNTER — Emergency Department: Payer: PPO

## 2020-10-23 ENCOUNTER — Ambulatory Visit (INDEPENDENT_AMBULATORY_CARE_PROVIDER_SITE_OTHER): Payer: PPO

## 2020-10-23 ENCOUNTER — Inpatient Hospital Stay
Admission: EM | Admit: 2020-10-23 | Discharge: 2020-10-27 | DRG: 177 | Disposition: A | Discharge status: Home / Self Care | Payer: PPO | Attending: Hospitalist | Admitting: Hospitalist

## 2020-10-23 ENCOUNTER — Ambulatory Visit
Admission: EM | Admit: 2020-10-23 | Discharge: 2020-10-23 | Disposition: A | Discharge status: Other Acute Inpatient Hospital | Payer: PPO | Source: Home / Self Care | Attending: Family Medicine | Admitting: Family Medicine

## 2020-10-23 DIAGNOSIS — U071 COVID-19: Principal | ICD-10-CM

## 2020-10-23 DIAGNOSIS — Z833 Family history of diabetes mellitus: Secondary | ICD-10-CM | POA: Diagnosis not present

## 2020-10-23 DIAGNOSIS — M109 Gout, unspecified: Secondary | ICD-10-CM | POA: Diagnosis present

## 2020-10-23 DIAGNOSIS — Z888 Allergy status to other drugs, medicaments and biological substances status: Secondary | ICD-10-CM

## 2020-10-23 DIAGNOSIS — R059 Cough, unspecified: Secondary | ICD-10-CM | POA: Diagnosis not present

## 2020-10-23 DIAGNOSIS — R0902 Hypoxemia: Secondary | ICD-10-CM | POA: Diagnosis not present

## 2020-10-23 DIAGNOSIS — M1990 Unspecified osteoarthritis, unspecified site: Secondary | ICD-10-CM | POA: Diagnosis not present

## 2020-10-23 DIAGNOSIS — J3489 Other specified disorders of nose and nasal sinuses: Secondary | ICD-10-CM | POA: Diagnosis not present

## 2020-10-23 DIAGNOSIS — R079 Chest pain, unspecified: Secondary | ICD-10-CM

## 2020-10-23 DIAGNOSIS — Z85828 Personal history of other malignant neoplasm of skin: Secondary | ICD-10-CM | POA: Diagnosis not present

## 2020-10-23 DIAGNOSIS — Z79899 Other long term (current) drug therapy: Secondary | ICD-10-CM | POA: Diagnosis not present

## 2020-10-23 DIAGNOSIS — J9601 Acute respiratory failure with hypoxia: Secondary | ICD-10-CM | POA: Diagnosis not present

## 2020-10-23 DIAGNOSIS — E785 Hyperlipidemia, unspecified: Secondary | ICD-10-CM | POA: Diagnosis present

## 2020-10-23 DIAGNOSIS — K219 Gastro-esophageal reflux disease without esophagitis: Secondary | ICD-10-CM | POA: Diagnosis present

## 2020-10-23 DIAGNOSIS — J1282 Pneumonia due to coronavirus disease 2019: Secondary | ICD-10-CM | POA: Diagnosis present

## 2020-10-23 DIAGNOSIS — I340 Nonrheumatic mitral (valve) insufficiency: Secondary | ICD-10-CM | POA: Diagnosis not present

## 2020-10-23 DIAGNOSIS — Z7982 Long term (current) use of aspirin: Secondary | ICD-10-CM | POA: Diagnosis not present

## 2020-10-23 DIAGNOSIS — Z882 Allergy status to sulfonamides status: Secondary | ICD-10-CM

## 2020-10-23 DIAGNOSIS — I4891 Unspecified atrial fibrillation: Secondary | ICD-10-CM

## 2020-10-23 DIAGNOSIS — R000 Tachycardia, unspecified: Secondary | ICD-10-CM | POA: Diagnosis not present

## 2020-10-23 DIAGNOSIS — I10 Essential (primary) hypertension: Secondary | ICD-10-CM | POA: Diagnosis not present

## 2020-10-23 DIAGNOSIS — R918 Other nonspecific abnormal finding of lung field: Secondary | ICD-10-CM | POA: Diagnosis not present

## 2020-10-23 DIAGNOSIS — I361 Nonrheumatic tricuspid (valve) insufficiency: Secondary | ICD-10-CM | POA: Diagnosis not present

## 2020-10-23 HISTORY — DX: Calculus of kidney: N20.0

## 2020-10-23 LAB — RESP PANEL BY RT-PCR (FLU A&B, COVID) ARPGX2
Influenza A by PCR: NEGATIVE
Influenza B by PCR: NEGATIVE
SARS Coronavirus 2 by RT PCR: POSITIVE — AB

## 2020-10-23 LAB — TROPONIN I (HIGH SENSITIVITY): Troponin I (High Sensitivity): 11 ng/L (ref <18)

## 2020-10-23 LAB — BASIC METABOLIC PANEL
Anion gap: 10 (ref 5–15)
BUN: 14 mg/dL (ref 8–23)
CO2: 26 mmol/L (ref 22–32)
Calcium: 8.4 mg/dL — ABNORMAL LOW (ref 8.9–10.3)
Chloride: 99 mmol/L (ref 98–111)
Creatinine, Ser: 1.04 mg/dL (ref 0.61–1.24)
GFR, Estimated: >60 mL/min (ref >60)
Glucose, Bld: 95 mg/dL (ref 70–99)
Potassium: 3.8 mmol/L (ref 3.5–5.1)
Sodium: 135 mmol/L (ref 135–145)

## 2020-10-23 LAB — CBC
HCT: 43.9 % (ref 39.0–52.0)
Hemoglobin: 14.7 g/dL (ref 13.0–17.0)
MCH: 30.6 pg (ref 26.0–34.0)
MCHC: 33.5 g/dL (ref 30.0–36.0)
MCV: 91.3 fL (ref 80.0–100.0)
Platelets: 199 10*3/uL (ref 150–400)
RBC: 4.81 MIL/uL (ref 4.22–5.81)
RDW: 13.2 % (ref 11.5–15.5)
WBC: 8.5 10*3/uL (ref 4.0–10.5)
nRBC: 0 % (ref 0.0–0.2)

## 2020-10-23 LAB — TSH: TSH: 1.053 u[IU]/mL (ref 0.350–4.500)

## 2020-10-23 LAB — LACTIC ACID, PLASMA: Lactic Acid, Venous: 1.5 mmol/L (ref 0.5–1.9)

## 2020-10-23 MED ORDER — METOPROLOL TARTRATE 25 MG PO TABS
25.0000 mg | ORAL_TABLET | Freq: Two times a day (BID) | ORAL | Status: DC
  Administered 2020-10-24: 25 mg via ORAL
  Filled 2020-10-23: qty 1

## 2020-10-23 MED ORDER — ONDANSETRON HCL 4 MG PO TABS: 4.0000 mg | ORAL_TABLET | Freq: Four times a day (QID) | ORAL | Status: DC | PRN

## 2020-10-23 MED ORDER — ALBUTEROL SULFATE HFA 108 (90 BASE) MCG/ACT IN AERS
2.0000 | INHALATION_SPRAY | Freq: Four times a day (QID) | RESPIRATORY_TRACT | Status: DC
  Administered 2020-10-24 – 2020-10-27 (×12): 2 via RESPIRATORY_TRACT
  Filled 2020-10-23: qty 6.7

## 2020-10-23 MED ORDER — ONDANSETRON HCL 4 MG/2ML IJ SOLN: 4.0000 mg | Freq: Four times a day (QID) | INTRAMUSCULAR | Status: DC | PRN

## 2020-10-23 MED ORDER — HYDROCOD POLST-CPM POLST ER 10-8 MG/5ML PO SUER
5.0000 mL | Freq: Two times a day (BID) | ORAL | Status: DC | PRN
  Administered 2020-10-24 – 2020-10-26 (×4): 5 mL via ORAL
  Filled 2020-10-23 (×4): qty 5

## 2020-10-23 MED ORDER — ASCORBIC ACID 500 MG PO TABS
500.0000 mg | ORAL_TABLET | Freq: Every day | ORAL | Status: DC
  Administered 2020-10-24 – 2020-10-27 (×4): 500 mg via ORAL
  Filled 2020-10-23 (×4): qty 1

## 2020-10-23 MED ORDER — SODIUM CHLORIDE 0.9 % IV SOLN
200.0000 mg | Freq: Once | INTRAVENOUS | Status: AC
  Administered 2020-10-24: 200 mg via INTRAVENOUS
  Filled 2020-10-23: qty 40

## 2020-10-23 MED ORDER — DILTIAZEM HCL-DEXTROSE 125-5 MG/125ML-% IV SOLN (PREMIX)
5.0000 mg/h | INTRAVENOUS | Status: DC
  Administered 2020-10-23: 2.5 mg/h via INTRAVENOUS
  Filled 2020-10-23: qty 125

## 2020-10-23 MED ORDER — ASPIRIN EC 81 MG PO TBEC
81.0000 mg | DELAYED_RELEASE_TABLET | Freq: Every day | ORAL | Status: DC
  Administered 2020-10-24: 81 mg via ORAL
  Filled 2020-10-23: qty 1

## 2020-10-23 MED ORDER — ENOXAPARIN SODIUM 60 MG/0.6ML ~~LOC~~ SOLN
50.0000 mg | SUBCUTANEOUS | Status: DC
  Administered 2020-10-24: 50 mg via SUBCUTANEOUS
  Filled 2020-10-23: qty 0.6

## 2020-10-23 MED ORDER — ACETAMINOPHEN 325 MG PO TABS
650.0000 mg | ORAL_TABLET | Freq: Four times a day (QID) | ORAL | Status: DC | PRN
  Administered 2020-10-24 (×2): 650 mg via ORAL
  Filled 2020-10-23 (×3): qty 2

## 2020-10-23 MED ORDER — ENOXAPARIN SODIUM 40 MG/0.4ML ~~LOC~~ SOLN: 40.0000 mg | SUBCUTANEOUS | Status: DC

## 2020-10-23 MED ORDER — DILTIAZEM HCL 25 MG/5ML IV SOLN
10.0000 mg | Freq: Once | INTRAVENOUS | Status: AC
  Administered 2020-10-23: 10 mg via INTRAVENOUS
  Filled 2020-10-23: qty 5

## 2020-10-23 MED ORDER — SODIUM CHLORIDE 0.9 % IV SOLN
100.0000 mg | Freq: Every day | INTRAVENOUS | Status: DC
  Administered 2020-10-25 – 2020-10-27 (×3): 100 mg via INTRAVENOUS
  Filled 2020-10-23 (×3): qty 20

## 2020-10-23 MED ORDER — ZINC SULFATE 220 (50 ZN) MG PO CAPS
220.0000 mg | ORAL_CAPSULE | Freq: Every day | ORAL | Status: DC
  Administered 2020-10-24 – 2020-10-27 (×4): 220 mg via ORAL
  Filled 2020-10-23 (×4): qty 1

## 2020-10-23 MED ORDER — GUAIFENESIN-DM 100-10 MG/5ML PO SYRP
10.0000 mL | ORAL_SOLUTION | ORAL | Status: DC | PRN
  Administered 2020-10-24 – 2020-10-25 (×2): 10 mL via ORAL
  Filled 2020-10-23 (×2): qty 10

## 2020-10-23 NOTE — ED Triage Notes (Signed)
Patient coming [facility] from [facility] urgent care for productive cough. Patient confirmed COVID + today. Patient with new onset Afib, HR < 100.

## 2020-10-23 NOTE — ED Triage Notes (Signed)
Patient complains of cough and runny nose x Saturday. Patient states he has hasn't had much of an appetite. Has not had any covid testing.

## 2020-10-23 NOTE — ED Provider Notes (Signed)
[facility] Emergency Department Provider Note   ____________________________________________   First MD Initiated Contact with Patient 10/23/20 2213     (approximate)  I have reviewed the triage vital signs and the nursing notes.   HISTORY  Chief Complaint Cough and Irregular Heart Beat   HPI [name] is a 74 y.o. male patient and his wife are sick been sick since Saturday 5 days ago.  He had a fever.  He went to the urgent care was diagnosed with Covid.  He was also noted to have A. fib with RVR with a pulse of 120.  He does not have any history of A. fib in the past.  He is not having any chest tightness or heaviness.         Past Medical History:  Diagnosis Date  . Arthritis   . GERD (gastroesophageal reflux disease)   . Gout   . Hyperlipidemia   . Hypertension   . Kidney stones   . 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 Pneumonia 02/25/2016  . Hypoxia 02/25/2016    Past Surgical History:  Procedure Laterality Date  . CATARACT EXTRACTION    . COLONOSCOPY WITH ESOPHAGOGASTRODUODENOSCOPY (EGD)    . COLONOSCOPY WITH PROPOFOL N/A 01/20/2017   Procedure: COLONOSCOPY WITH PROPOFOL;  Surgeon: Manya Silvas, MD;  Location: [facility] ENDOSCOPY;  Service: Endoscopy;  Laterality: N/A;  . REPLACEMENT TOTAL KNEE Right     Prior to Admission medications   Medication Sig Start Date End Date Taking? Authorizing Provider  aspirin EC 81 MG tablet Take 81 mg by mouth daily.    [provider]  ibuprofen (ADVIL,MOTRIN) 200 MG tablet Take 200 mg by mouth every 6 (six) hours as needed.    [provider]  lisinopril (PRINIVIL,ZESTRIL) 20 MG tablet Take 1 tablet by mouth 2 (two) times daily.  02/10/16   [provider]  lovastatin (MEVACOR) 40 MG tablet Take 1 tablet by  mouth every evening. 02/18/16   [provider]  Multiple Vitamin (MULTIVITAMIN WITH MINERALS) TABS tablet Take 1 tablet by mouth daily.    [provider]  omeprazole (PRILOSEC) 20 MG capsule Take 1 capsule by mouth daily. 02/05/16   [provider]  sildenafil (REVATIO) 20 MG tablet Take 1 tablet by mouth as needed. 01/31/16   [provider]    Allergies Lipitor [atorvastatin], Sulfa antibiotics, and Zocor [simvastatin]  Family History  Problem Relation Age of Onset  . Cancer Mother   . Liver cancer Father   . Diabetes Father     Social History Social History   Tobacco Use  . Smoking status: Never Smoker  . Smokeless tobacco: Never Used  Substance Use Topics  . Alcohol use: No  . Drug use: No    Review of Systems  Constitutional:  fever/chills Eyes: No visual changes. ENT: No sore throat. Cardiovascular: Denies chest pain. Respiratory: Some shortness of breath. Gastrointestinal: No abdominal pain.  No nausea, no vomiting.  No diarrhea.  No constipation. Genitourinary: Negative for dysuria. Musculoskeletal: Negative for back pain. Skin: Negative for rash. Neurological: Negative for headaches, focal weakness   ____________________________________________   PHYSICAL EXAM:  VITAL SIGNS: ED Triage Vitals  Enc Vitals Group     BP 10/23/20 2140 (!) 140/98  Pulse Rate 10/23/20 2140 (!) 120     Resp 10/23/20 2140 (!) 24     Temp 10/23/20 2140 100.3 F (37.9 C)     Temp Source 10/23/20 2140 Oral     SpO2 10/23/20 2140 95 %     Weight 10/23/20 2138 220 lb (99.8 kg)     Height 10/23/20 2138 5\' 10"  (1.778 m)     Head Circumference --      Peak Flow --      Pain Score 10/23/20 2138 0     Pain Loc --      Pain Edu? --      Excl. in [place]? --     Constitutional: Alert and oriented. Well appearing and in no acute distress. Eyes: Conjunctivae are normal.  Head: Atraumatic. Nose: No congestion/rhinnorhea. Mouth/Throat: Mucous  membranes are moist.  Oropharynx non-erythematous. Neck: No stridor. Cardiovascular: Rapid rate, on auscultation it sounds like regular rhythm. Grossly normal heart sounds.  Good peripheral circulation. Respiratory: Normal respiratory effort.  No retractions. Lungs scattered crackles Gastrointestinal: Soft and nontender. No distention. No abdominal bruits.  Musculoskeletal: No lower extremity tenderness nor edema.   Neurologic:  Normal speech and language. No gross focal neurologic deficits are appreciated. No gait instability. Skin:  Skin is warm, dry and intact. No rash noted.   ____________________________________________   LABS (all labs ordered are listed, but only abnormal results are displayed)  Labs Reviewed  BASIC METABOLIC PANEL - Abnormal; Notable for the following components:      Result Value   Calcium 8.4 (*)    All other components within normal limits  CULTURE, BLOOD (ROUTINE X 2)  CULTURE, BLOOD (ROUTINE X 2)  CBC  LACTIC ACID, PLASMA  LACTIC ACID, PLASMA  TSH  TROPONIN I (HIGH SENSITIVITY)   ____________________________________________  EKG  EKG read interpreted by me shows A. fib with RVR at a rate of 118 left axis some apparent rate related changes ____________________________________________  RADIOLOGY Gertha Calkin, personally viewed and evaluated these images (plain radiographs) as part of my medical decision making, as well as reviewing the written report by the radiologist.  ED MD interpretation: Chest x-ray read by radiology reviewed by me shows what appears to be a bilateral patchy pneumonia consistent with Covid.  Official radiology report(s): DG Chest 2 View  Result Date: 10/23/2020 CLINICAL DATA:  74 year old male with cough and rhinorrhea for 4 days. EXAM: CHEST - 2 VIEW COMPARISON:  CTA chest 02/25/2016 and earlier. FINDINGS: Stable cardiomegaly and mediastinal contours. Visualized tracheal air column is within normal limits. Patchy and  indistinct asymmetric lower lung opacity greater on the left. No superimposed pneumothorax or pulmonary edema. No pleural effusion. Mild respiratory motion on the lateral views. No acute osseous abnormality identified. Negative visible bowel gas pattern. IMPRESSION: Asymmetric patchy, indistinct bilateral lower lung opacity is suspicious for acute viral/atypical respiratory infection. No pleural effusion. Electronically Signed   By: Genevie Ann M.D.   On: 10/23/2020 20:29   DG Chest Port 1 View  Result Date: 10/23/2020 CLINICAL DATA:  Productive cough COVID positive EXAM: PORTABLE CHEST 1 VIEW COMPARISON:  10/23/2020 FINDINGS: Patchy bilateral mid to lower lung opacities. No pleural effusion. Mild cardiomegaly. No pneumothorax. IMPRESSION: Patchy bilateral mid to lower lung opacities suspicious for bilateral pneumonia. This does not appear significantly changed. Electronically Signed   By: Donavan Foil M.D.   On: 10/23/2020 22:12    ____________________________________________   PROCEDURES  Procedure(s) performed (including Critical Care):  Procedures  ____________________________________________   INITIAL IMPRESSION / ASSESSMENT AND PLAN / ED COURSE  Patient with new onset A. fib and Covid pneumonia.  He is not hypoxic.  His TSH is not elevated.  His troponin is not elevated either.  However he does have new onset A. fib with RVR and the potential for rapid deterioration with the Covid.  We will get him in the hospital and work on his new onset A. fib with at least rate control and of course an ultrasound of his heart.              ____________________________________________   FINAL CLINICAL IMPRESSION(S) / ED DIAGNOSES  Final diagnoses:  COVID  New onset a-fib ([place])  Atrial fibrillation with RVR [facility])     ED Discharge Orders    None      *Please note:  DEVONN GIAMPIETRO was evaluated in Emergency Department on 10/23/2020 for the symptoms described in the history of  present illness. He was evaluated in the context of the global COVID-19 pandemic, which necessitated consideration that the patient might be at risk for infection with the SARS-CoV-2 virus that causes COVID-19. Institutional protocols and algorithms that pertain to the evaluation of patients at risk for COVID-19 are in a state of rapid change based on information released by regulatory bodies including the CDC and federal and state organizations. These policies and algorithms were followed during the patient's care in the ED.  Some ED evaluations and interventions may be delayed as a result of limited staffing during and the pandemic.*   Note:  This document was prepared using Dragon voice recognition software and may include unintentional dictation errors.    Nena Polio, MD 10/23/20 [phone]

## 2020-10-23 NOTE — ED Provider Notes (Signed)
[facility]    CSN: [id number] Arrival date & time: 10/23/20  1931  History   Chief Complaint Chief Complaint  Patient presents with  . Cough   HPI  74 year old male presents with cough and runny nose.  Patient has been sick since Saturday.  His wife is also sick.  He has had cough and runny nose.  She had a decreased appetite.  No documented fever.  Temperature elevated currently at 99.9.  Mild discomfort currently.  No relieving factors.  Has not had recent Covid testing.  No reported sick contacts other than his wife.  No known relieving factors.  No other complaints.  Past Medical History:  Diagnosis Date  . Arthritis   . Chronic kidney disease   . GERD (gastroesophageal reflux disease)   . Gout   . Hyperlipidemia   . Hypertension   . 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 Squamous cell carcinoma of skin 07/09/2014   Anterior neck, In situ   Patient Active Problem List   Diagnosis Date Noted  . Pneumonia 02/25/2016  . Hypoxia 02/25/2016   Past Surgical History:  Procedure Laterality Date  . CATARACT EXTRACTION    . COLONOSCOPY WITH ESOPHAGOGASTRODUODENOSCOPY (EGD)    . COLONOSCOPY WITH PROPOFOL N/A 01/20/2017   Procedure: COLONOSCOPY WITH PROPOFOL;  Surgeon: Manya Silvas, MD;  Location: [facility] ENDOSCOPY;  Service: Endoscopy;  Laterality: N/A;  . REPLACEMENT TOTAL KNEE Right     Home Medications    Prior to Admission medications   Medication Sig Start Date End Date Taking? Authorizing Provider  aspirin EC 81 MG tablet Take 81 mg by mouth daily.   Yes [provider]  ibuprofen (ADVIL,MOTRIN) 200 MG tablet Take 200 mg by mouth every 6 (six) hours as needed.   Yes [provider]  lisinopril (PRINIVIL,ZESTRIL) 20 MG tablet Take 1 tablet by mouth 2 (two) times daily.  02/10/16  Yes [provider]  lovastatin (MEVACOR) 40 MG tablet Take 1 tablet by mouth every evening. 02/18/16  Yes [provider]  Multiple Vitamin (MULTIVITAMIN WITH MINERALS) TABS tablet Take 1 tablet by mouth daily.   Yes [provider]  omeprazole (PRILOSEC) 20 MG capsule Take 1 capsule by mouth daily. 02/05/16  Yes [provider]  sildenafil (REVATIO) 20 MG tablet Take 1 tablet by mouth as needed. 01/31/16  Yes [provider]    Family History Family History  Problem Relation Age of Onset  . Cancer Mother   . Liver cancer Father   . Diabetes Father     Social History Social History   Tobacco Use  . Smoking status: Never Smoker  . Smokeless tobacco: Never Used  Substance Use Topics  . Alcohol use: No  . Drug use: No     Allergies   Lipitor [atorvastatin], Sulfa antibiotics, and Zocor [simvastatin]   Review of Systems Review of Systems  Constitutional: Positive for appetite change.  HENT: Positive for rhinorrhea.   Respiratory: Positive for cough.    Physical Exam Triage Vital Signs ED Triage Vitals  Enc Vitals Group     BP 10/23/20 1954 (!) 149/92     Pulse Rate 10/23/20 1954 (!) 131     Resp 10/23/20 1954 18     Temp 10/23/20 1954 99.9 F (37.7 C)     Temp Source 10/23/20 1954 Oral     SpO2 10/23/20 1954 95 %     Weight 10/23/20 1952 220  lb (99.8 kg)     Height 10/23/20 1952 5\' 10"  (1.778 m)     Head Circumference --      Peak Flow --      Pain Score 10/23/20 1952 2     Pain Loc --      Pain Edu? --      Excl. in [place]? --    Updated Vital Signs BP (!) 149/92 (BP Location: Left Arm)   Pulse (!) 131   Temp 99.9 F (37.7 C) (Oral)   Resp 18   Ht 5\' 10"  (1.778 m)   Wt 99.8 kg   SpO2 95%   BMI 31.57 kg/m   Visual Acuity Right Eye Distance:   Left Eye Distance:   Bilateral Distance:    Right Eye Near:   Left Eye Near:    Bilateral Near:     Physical Exam Constitutional:      Comments: Mildly ill appearing.  HENT:     Head: Normocephalic and atraumatic.  Eyes:     General:        Right eye: No discharge.        Left eye:  No discharge.     Conjunctiva/sclera: Conjunctivae normal.  Cardiovascular:     Rate and Rhythm: Tachycardia present. Rhythm irregular.  Pulmonary:     Effort: No respiratory distress.     Breath sounds: Rales present.  Neurological:     Mental Status: He is alert.  Psychiatric:        Mood and Affect: Mood normal.        Behavior: Behavior normal.    UC Treatments / Results  Labs (all labs ordered are listed, but only abnormal results are displayed) Labs Reviewed  RESP PANEL BY RT-PCR (FLU A&B, COVID) ARPGX2 - Abnormal; Notable for the following components:      Result Value   SARS Coronavirus 2 by RT PCR POSITIVE (*)    All other components within normal limits    EKG Interpretation: Atrial fibrillation with rapid ventricular response at the rate of 111.  Radiology DG Chest 2 View  Result Date: 10/23/2020 CLINICAL DATA:  74 year old male with cough and rhinorrhea for 4 days. EXAM: CHEST - 2 VIEW COMPARISON:  CTA chest 02/25/2016 and earlier. FINDINGS: Stable cardiomegaly and mediastinal contours. Visualized tracheal air column is within normal limits. Patchy and indistinct asymmetric lower lung opacity greater on the left. No superimposed pneumothorax or pulmonary edema. No pleural effusion. Mild respiratory motion on the lateral views. No acute osseous abnormality identified. Negative visible bowel gas pattern. IMPRESSION: Asymmetric patchy, indistinct bilateral lower lung opacity is suspicious for acute viral/atypical respiratory infection. No pleural effusion. Electronically Signed   By: Genevie Ann M.D.   On: 10/23/2020 20:29    Procedures Procedures (including critical care time)  Medications Ordered in UC Medications - No data to display  Initial Impression / Assessment and Plan / UC Course  I have reviewed the triage vital signs and the nursing notes.  Pertinent labs & imaging results that were available during my care of the patient were reviewed by me and considered  in my medical decision making (see chart for details).    74 year old male presents with COVID-19 and subsequent COVID-19 pneumonia.  New onset A. fib.  Patient is acutely ill.  Requires further management in the hospital.  May need hospital admission.  Sending to hospital via EMS.  Final Clinical Impressions(s) / UC Diagnoses   Final diagnoses:  Pneumonia due to  COVID-19 virus  New onset atrial fibrillation [facility])   Discharge Instructions   None    ED Prescriptions    None     PDMP not reviewed this encounter.   Coral Spikes, [place] 10/23/20 2107

## 2020-10-23 NOTE — ED Notes (Signed)
Patient is being discharged from the Urgent Care and sent to the Emergency Department via EMS . Per Dr. Lacinda Axon, patient is in need of higher level of care due to new onset A-fib with RVR and COVID positive. Patient is aware and verbalizes understanding of plan of care.  Vitals:   10/23/20 1954  BP: (!) 149/92  Pulse: (!) 131  Resp: 18  Temp: 99.9 F (37.7 C)  SpO2: 95%

## 2020-10-23 NOTE — H&P (Signed)
History and Physical    [name] TKP:[id number] DOB: [birth year] DOA: 10/23/2020  PCP: Juluis Pitch, MD   Patient coming from: Home  I have personally briefly reviewed patient's old medical records in [place]  Chief Complaint: Cough, fever  HPI: [name] is a 74 y.o. male with medical history significant for gout, HTN and HLD and arthritis who presents to the emergency room with a several day history of cough, nasal congestion decreased appetite and low-grade fever.  Wife currently has similar symptoms.  Denies chest pain, palpitations, nausea vomiting or diarrhea. ED Course: On arrival temperature is 99.9 increasing to 100.3 while in the ED, heart rate 131 with EKG showing atrial fibrillation with no prior history.  BP 149/92.  O2 sat 95% on room air.  Covid returns as positive.  Blood work was unremarkable.  Chest x-ray showed patchy bilateral mid to lower lung opacities EKG as reviewed by me : Rapid A. fib with rate of 118 Hospitalist consulted for admission.  Review of Systems: As per HPI otherwise all other systems on review of systems negative.    Past Medical History:  Diagnosis Date  . Arthritis   . GERD (gastroesophageal reflux disease)   . Gout   . Hyperlipidemia   . Hypertension   . Kidney stones   . 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 Squamous cell carcinoma of skin 07/09/2014   Anterior neck, In situ    Past Surgical History:  Procedure Laterality Date  . CATARACT EXTRACTION    . COLONOSCOPY WITH ESOPHAGOGASTRODUODENOSCOPY (EGD)    . COLONOSCOPY WITH PROPOFOL N/A 01/20/2017   Procedure: COLONOSCOPY WITH PROPOFOL;  Surgeon: Manya Silvas, MD;  Location: [facility] ENDOSCOPY;  Service: Endoscopy;  Laterality: N/A;  . REPLACEMENT TOTAL KNEE Right      reports that he has never smoked. He has never used smokeless tobacco. He reports that he does not drink alcohol and does not use drugs.  Allergies  Allergen  Reactions  . Lipitor [Atorvastatin] Other (See Comments)    Bone and muscle pain.  . Sulfa Antibiotics Hives and Rash  . Zocor [Simvastatin]     Family History  Problem Relation Age of Onset  . Cancer Mother   . Liver cancer Father   . Diabetes Father       Prior to Admission medications   Medication Sig Start Date End Date Taking? Authorizing Provider  aspirin EC 81 MG tablet Take 81 mg by mouth every other day.    Yes [provider]  ibuprofen (ADVIL,MOTRIN) 200 MG tablet Take 200 mg by mouth every 6 (six) hours as needed.   Yes [provider]  lisinopril (PRINIVIL,ZESTRIL) 20 MG tablet Take 1 tablet by mouth daily.  02/10/16  Yes [provider]  lovastatin (MEVACOR) 40 MG tablet Take 1 tablet by mouth every evening. 02/18/16  Yes [provider]  Multiple Vitamin (MULTIVITAMIN WITH MINERALS) TABS tablet Take 1 tablet by mouth daily.   Yes [provider]  omeprazole (PRILOSEC) 20 MG capsule Take 1 capsule by mouth daily. 02/05/16  Yes [provider]  sildenafil (REVATIO) 20 MG tablet Take 1 tablet by mouth as needed. Patient not taking: Reported on 10/23/2020 01/31/16   [provider]    Physical Exam: Vitals:   10/23/20 2138 10/23/20 2140 10/23/20 2300  BP:  (!) 140/98 137/81  Pulse:  (!) 120 (!) 109  Resp:  (!) 24 Marland Kitchen)  43  Temp:  100.3 F (37.9 C) 99.8 F (37.7 C)  TempSrc:  Oral   SpO2:  95% 95%  Weight: 99.8 kg    Height: 5\' 10"  (1.778 m)       Vitals:   10/23/20 2138 10/23/20 2140 10/23/20 2300  BP:  (!) 140/98 137/81  Pulse:  (!) 120 (!) 109  Resp:  (!) 24 (!) 43  Temp:  100.3 F (37.9 C) 99.8 F (37.7 C)  TempSrc:  Oral   SpO2:  95% 95%  Weight: 99.8 kg    Height: 5\' 10"  (1.778 m)        Constitutional: Alert and oriented x 3 . Not in any apparent distress HEENT:      Head: Normocephalic and atraumatic.         Eyes: PERLA, EOMI, Conjunctivae are normal. Sclera is non-icteric.        Mouth/Throat: Mucous membranes are moist.       Neck: Supple with no signs of meningismus. Cardiovascular: Regular rate and rhythm. No murmurs, gallops, or rubs. 2+ symmetrical distal pulses are present . No JVD. No LE edema Respiratory: Respiratory effort increased with coarse breath sounds bilaterally Gastrointestinal: Soft, non tender, and non distended with positive bowel sounds. No rebound or guarding. Genitourinary: No CVA tenderness. Musculoskeletal: Nontender with normal range of motion in all extremities. No cyanosis, or erythema of extremities. Neurologic:  Face is symmetric. Moving all extremities. No gross focal neurologic deficits . Skin: Skin is warm, dry.  No rash or ulcers Psychiatric: Mood and affect are normal    Labs on Admission: I have personally reviewed following labs and imaging studies  CBC: Recent Labs  Lab 10/23/20 2200  WBC 8.5  HGB 14.7  HCT 43.9  MCV 91.3  PLT 497   Basic Metabolic Panel: Recent Labs  Lab 10/23/20 2200  NA 135  K 3.8  CL 99  CO2 26  GLUCOSE 95  BUN 14  CREATININE 1.04  CALCIUM 8.4*   GFR: Estimated Creatinine Clearance: 74.9 mL/min (by C-G formula based on SCr of 1.04 mg/dL). Liver Function Tests: No results for input(s): AST, ALT, ALKPHOS, BILITOT, PROT, ALBUMIN in the last 168 hours. No results for input(s): LIPASE, AMYLASE in the last 168 hours. No results for input(s): AMMONIA in the last 168 hours. Coagulation Profile: No results for input(s): INR, PROTIME in the last 168 hours. Cardiac Enzymes: No results for input(s): CKTOTAL, CKMB, CKMBINDEX, TROPONINI in the last 168 hours. BNP (last 3 results) No results for input(s): PROBNP in the last 8760 hours. HbA1C: No results for input(s): HGBA1C in the last 72 hours. CBG: No results for input(s): GLUCAP in the last 168 hours. Lipid Profile: No results for input(s): CHOL, HDL, LDLCALC, TRIG, CHOLHDL, LDLDIRECT in the last 72 hours. Thyroid Function Tests: Recent  Labs    10/23/20 2200  TSH 1.053   Anemia Panel: No results for input(s): VITAMINB12, FOLATE, FERRITIN, TIBC, IRON, RETICCTPCT in the last 72 hours. Urine analysis:    Component Value Date/Time   COLORURINE Yellow 04/25/2012 0841   APPEARANCEUR Hazy 04/25/2012 0841   LABSPEC 1.015 04/25/2012 0841   PHURINE 5.0 04/25/2012 0841   GLUCOSEU Negative 04/25/2012 0841   HGBUR Negative 04/25/2012 0841   BILIRUBINUR Negative 04/25/2012 0841   KETONESUR Negative 04/25/2012 0841   PROTEINUR Negative 04/25/2012 0841   NITRITE Negative 04/25/2012 0841   LEUKOCYTESUR 1+ 04/25/2012 0841    Radiological Exams on Admission: DG Chest 2 View  Result Date:  10/23/2020 CLINICAL DATA:  74 year old male with cough and rhinorrhea for 4 days. EXAM: CHEST - 2 VIEW COMPARISON:  CTA chest 02/25/2016 and earlier. FINDINGS: Stable cardiomegaly and mediastinal contours. Visualized tracheal air column is within normal limits. Patchy and indistinct asymmetric lower lung opacity greater on the left. No superimposed pneumothorax or pulmonary edema. No pleural effusion. Mild respiratory motion on the lateral views. No acute osseous abnormality identified. Negative visible bowel gas pattern. IMPRESSION: Asymmetric patchy, indistinct bilateral lower lung opacity is suspicious for acute viral/atypical respiratory infection. No pleural effusion. Electronically Signed   By: Genevie Ann M.D.   On: 10/23/2020 20:29   DG Chest Port 1 View  Result Date: 10/23/2020 CLINICAL DATA:  Productive cough COVID positive EXAM: PORTABLE CHEST 1 VIEW COMPARISON:  10/23/2020 FINDINGS: Patchy bilateral mid to lower lung opacities. No pleural effusion. Mild cardiomegaly. No pneumothorax. IMPRESSION: Patchy bilateral mid to lower lung opacities suspicious for bilateral pneumonia. This does not appear significantly changed. Electronically Signed   By: Donavan Foil M.D.   On: 10/23/2020 22:12     Assessment/Plan 74 year old male with history of  gout, HTN, HLD and arthritis presenting with cough and nasal congestion, Covid positive with pneumonia on chest x-ray and incidental finding of rapid A. fib, new onset    Pneumonia due to COVID-19 virus -Covid pneumonia on chest x-ray, saturating in the mid 90s on room air -Remdesivir, albuterol, antitussives and vitamins -No steroids for now as patient not hypoxic    New onset Rapid atrial fibrillation (HCC) -EKG with A. fib at 118 -CHA2DS2-VASc score of 1 so not a candidate for systemic anticoagulation -Daily aspirin, metoprolol twice daily -Echocardiogram -Ccardiology consult    Hypertension -Hold home lisinopril for now    Gout, joint -Chronic and stable no complaints.    DVT prophylaxis: Lovenox  Code Status: full code  Family Communication:  none  Disposition Plan: Back to previous home environment Consults called: Cardiology Status:At the time of admission, it appears that the appropriate admission status for this patient is INPATIENT. This is judged to be reasonable and necessary in order to provide the required intensity of service to ensure the patient's safety given the presenting symptoms, physical exam findings, and initial radiographic and laboratory data in the context of their  Comorbid conditions.   Patient requires inpatient status due to high intensity of service, high risk for further deterioration and high frequency of surveillance required.   I certify that at the point of admission it is my clinical judgment that the patient will require inpatient hospital care spanning beyond [place] MD [facility]     10/23/2020, 11:45 PM

## 2020-10-24 ENCOUNTER — Inpatient Hospital Stay (HOSPITAL_COMMUNITY)
Admit: 2020-10-24 | Discharge: 2020-10-24 | Disposition: A | Discharge status: Home / Self Care | Payer: PPO | Attending: Internal Medicine | Admitting: Internal Medicine

## 2020-10-24 DIAGNOSIS — I361 Nonrheumatic tricuspid (valve) insufficiency: Secondary | ICD-10-CM

## 2020-10-24 DIAGNOSIS — I4891 Unspecified atrial fibrillation: Secondary | ICD-10-CM

## 2020-10-24 DIAGNOSIS — I340 Nonrheumatic mitral (valve) insufficiency: Secondary | ICD-10-CM

## 2020-10-24 LAB — LACTIC ACID, PLASMA: Lactic Acid, Venous: 1.1 mmol/L (ref 0.5–1.9)

## 2020-10-24 LAB — ECHOCARDIOGRAM COMPLETE
AR max vel: 2.98 cm2
AV Area VTI: 3.14 cm2
AV Area mean vel: 2.82 cm2
AV Mean grad: 4 mmHg
AV Peak grad: 6.7 mmHg
Ao pk vel: 1.29 m/s
Area-P 1/2: 6.36 cm2
Calc EF: 28.6 %
Height: 70 in
S' Lateral: 4.05 cm
Single Plane A2C EF: 26.2 %
Single Plane A4C EF: 32.9 %
Weight: 3520 oz

## 2020-10-24 LAB — CBC WITH DIFFERENTIAL/PLATELET
Abs Immature Granulocytes: 0.03 10*3/uL (ref 0.00–0.07)
Basophils Absolute: 0.1 10*3/uL (ref 0.0–0.1)
Basophils Relative: 1 %
Eosinophils Absolute: 0.1 10*3/uL (ref 0.0–0.5)
Eosinophils Relative: 1 %
HCT: 40.8 % (ref 39.0–52.0)
Hemoglobin: 13.5 g/dL (ref 13.0–17.0)
Immature Granulocytes: 0 %
Lymphocytes Relative: 15 %
Lymphs Abs: 1.4 10*3/uL (ref 0.7–4.0)
MCH: 30.2 pg (ref 26.0–34.0)
MCHC: 33.1 g/dL (ref 30.0–36.0)
MCV: 91.3 fL (ref 80.0–100.0)
Monocytes Absolute: 0.9 10*3/uL (ref 0.1–1.0)
Monocytes Relative: 9 %
Neutro Abs: 7 10*3/uL (ref 1.7–7.7)
Neutrophils Relative %: 74 %
Platelets: 160 10*3/uL (ref 150–400)
RBC: 4.47 MIL/uL (ref 4.22–5.81)
RDW: 13.4 % (ref 11.5–15.5)
WBC: 9.4 10*3/uL (ref 4.0–10.5)
nRBC: 0 % (ref 0.0–0.2)

## 2020-10-24 LAB — COMPREHENSIVE METABOLIC PANEL
ALT: 19 U/L (ref 0–44)
AST: 31 U/L (ref 15–41)
Albumin: 3.4 g/dL — ABNORMAL LOW (ref 3.5–5.0)
Alkaline Phosphatase: 42 U/L (ref 38–126)
Anion gap: 9 (ref 5–15)
BUN: 13 mg/dL (ref 8–23)
CO2: 25 mmol/L (ref 22–32)
Calcium: 8.2 mg/dL — ABNORMAL LOW (ref 8.9–10.3)
Chloride: 100 mmol/L (ref 98–111)
Creatinine, Ser: 1 mg/dL (ref 0.61–1.24)
GFR, Estimated: >60 mL/min (ref >60)
Glucose, Bld: 113 mg/dL — ABNORMAL HIGH (ref 70–99)
Potassium: 3.9 mmol/L (ref 3.5–5.1)
Sodium: 134 mmol/L — ABNORMAL LOW (ref 135–145)
Total Bilirubin: 0.7 mg/dL (ref 0.3–1.2)
Total Protein: 7.1 g/dL (ref 6.5–8.1)

## 2020-10-24 LAB — FERRITIN: Ferritin: 126 ng/mL (ref 24–336)

## 2020-10-24 LAB — FIBRIN DERIVATIVES D-DIMER (ARMC ONLY): Fibrin derivatives D-dimer (ARMC): 1018.03 ng/mL (FEU) — ABNORMAL HIGH (ref 0.00–499.00)

## 2020-10-24 LAB — TROPONIN I (HIGH SENSITIVITY): Troponin I (High Sensitivity): 12 ng/L (ref <18)

## 2020-10-24 LAB — C-REACTIVE PROTEIN: CRP: 15 mg/dL — ABNORMAL HIGH (ref <1.0)

## 2020-10-24 MED ORDER — METOPROLOL TARTRATE 50 MG PO TABS
50.0000 mg | ORAL_TABLET | Freq: Two times a day (BID) | ORAL | Status: DC
  Administered 2020-10-24 – 2020-10-27 (×6): 50 mg via ORAL
  Filled 2020-10-24 (×6): qty 1

## 2020-10-24 MED ORDER — METHYLPREDNISOLONE SODIUM SUCC 40 MG IJ SOLR
40.0000 mg | Freq: Three times a day (TID) | INTRAMUSCULAR | Status: DC
  Administered 2020-10-24 – 2020-10-26 (×6): 40 mg via INTRAVENOUS
  Filled 2020-10-24 (×6): qty 1

## 2020-10-24 MED ORDER — METOPROLOL TARTRATE 25 MG PO TABS
ORAL_TABLET | ORAL | Status: AC
  Administered 2020-10-24: 25 mg via ORAL
  Filled 2020-10-24: qty 1

## 2020-10-24 MED ORDER — METOPROLOL TARTRATE 25 MG PO TABS
25.0000 mg | ORAL_TABLET | Freq: Once | ORAL | Status: DC
  Filled 2020-10-24: qty 1

## 2020-10-24 MED ORDER — APIXABAN 5 MG PO TABS
5.0000 mg | ORAL_TABLET | Freq: Two times a day (BID) | ORAL | Status: DC
  Administered 2020-10-24 – 2020-10-27 (×7): 5 mg via ORAL
  Filled 2020-10-24 (×7): qty 1

## 2020-10-24 NOTE — Progress Notes (Signed)
*  PRELIMINARY RESULTS* Echocardiogram 2D Echocardiogram has been performed.  [name] 10/24/2020, 9:44 AM

## 2020-10-24 NOTE — ED Notes (Signed)
Patient up at bedside using urinal.

## 2020-10-24 NOTE — Progress Notes (Signed)
PROGRESS NOTE    [name]  MEQ:[id number] DOB: 1946/10/16 DOA: 10/23/2020 PCP: Juluis Pitch, MD    Assessment & Plan:   Principal Problem:   Pneumonia due to COVID-19 virus Active Problems:   Hypertension   Gout, joint   Rapid atrial fibrillation (HCC)    [name] is a 74 y.o. male with medical history significant for gout, HTN and HLD and arthritis who presents to the emergency room with a several day history of cough, nasal congestion decreased appetite and low-grade fever.   Acute hypoxic respiratory failure 2/2 COVID PNA --No hypoxic on presentation, however, later was found to be 88% on RA.  Was put on 2L O2. --Continue supplemental O2 to keep sats >=90%, wean as tolerated  Pneumonia due to COVID-19 virus --Chest x-ray showed patchy bilateral mid to lower lung opacities -started on Remdesivir, albuterol, antitussives and vitamins on admission, but no steroids. PLAN: --cont Remdesivir --start solumedrol 40 mg q8h as CRP is elevated --monitor and ensure CRP down-trending --albuterol q6h    New onset Rapid atrial fibrillation (HCC) -EKG with A. fib at 118 -CHA2DS2-VASc score of 1  --cardiologist consulted on admission, started on metop 25 mg BID and Eliquis PLAN: --increase metop to 50 mg BID --cont Eliquis (new)    Hypertension --BP low normal. --increase metop to 50 mg BID --Hold home Lisinopril    Gout, joint -Chronic and stable no complaints.   DVT prophylaxis: WL:[id number] Code Status: Full code  Family Communication:  Status is: inpatient Dispo:   The patient is from: home Anticipated d/c is to: home Anticipated d/c date is: 2-3 days Patient currently is not medically stable to d/c due to: covid, hypoxic, on treatment   Subjective and Interval History:  Pt reported feeling better.  Had a dry cough.  No N/V/D.   Objective: Vitals:   10/25/20 0028 10/25/20 0513 10/25/20 0840 10/25/20 1613  BP: 106/70 106/82 104/71 110/80   Pulse: 98 93 99 89  Resp: 19 19 18 16   Temp: 98 F (36.7 C) (!) 97.4 F (36.3 C) 98.6 F (37 C) 97.8 F (36.6 C)  TempSrc: Oral Oral Oral   SpO2: 92% 90% (!) 87% (!) 87%  Weight:      Height:       No intake or output data in the 24 hours ending 10/25/20 1823 Filed Weights   10/23/20 2138  Weight: 99.8 kg    Examination:   Constitutional: NAD, AAOx3 HEENT: conjunctivae and lids normal, EOMI CV: irregular, tachycardic, No cyanosis.   RESP: crackles over posterior bases, on 3L Extremities: No effusions, edema in BLE SKIN: warm, dry and intact Neuro: II - XII grossly intact.   Psych: Normal mood and affect.  Appropriate judgement and reason   Data Reviewed: I have personally reviewed following labs and imaging studies  CBC: Recent Labs  Lab 10/23/20 2200 10/24/20 0526 10/25/20 0453  WBC 8.5 9.4 13.9*  NEUTROABS  --  7.0  --   HGB 14.7 13.5 16.0  HCT 43.9 40.8 46.3  MCV 91.3 91.3 89.6  PLT 199 160 941   Basic Metabolic Panel: Recent Labs  Lab 10/23/20 2200 10/24/20 0526 10/25/20 0453  NA 135 134* 131*  K 3.8 3.9 4.5  CL 99 100 96*  CO2 26 25 22   GLUCOSE 95 113* 135*  BUN 14 13 21   CREATININE 1.04 1.00 1.05  CALCIUM 8.4* 8.2* 9.0  MG  --   --  2.1   GFR: Estimated  Creatinine Clearance: 74.2 mL/min (by C-G formula based on SCr of 1.05 mg/dL). Liver Function Tests: Recent Labs  Lab 10/24/20 0526  AST 31  ALT 19  ALKPHOS 42  BILITOT 0.7  PROT 7.1  ALBUMIN 3.4*   No results for input(s): LIPASE, AMYLASE in the last 168 hours. No results for input(s): AMMONIA in the last 168 hours. Coagulation Profile: No results for input(s): INR, PROTIME in the last 168 hours. Cardiac Enzymes: No results for input(s): CKTOTAL, CKMB, CKMBINDEX, TROPONINI in the last 168 hours. BNP (last 3 results) No results for input(s): PROBNP in the last 8760 hours. HbA1C: No results for input(s): HGBA1C in the last 72 hours. CBG: No results for input(s): GLUCAP in the  last 168 hours. Lipid Profile: No results for input(s): CHOL, HDL, LDLCALC, TRIG, CHOLHDL, LDLDIRECT in the last 72 hours. Thyroid Function Tests: Recent Labs    10/23/20 2200  TSH 1.053   Anemia Panel: Recent Labs    10/24/20 0526  FERRITIN 126   Sepsis Labs: Recent Labs  Lab 10/23/20 2200 10/24/20 0037  LATICACIDVEN 1.5 1.1    Recent Results (from the past 240 hour(s))  Blood culture (routine x 2)     Status: None (Preliminary result)   Collection Time: 10/23/20 12:37 AM   Specimen: BLOOD  Result Value Ref Range Status   Specimen Description BLOOD LEFT ANTECUBITAL  Final   Special Requests   Final    BOTTLES DRAWN AEROBIC AND ANAEROBIC Blood Culture adequate volume   Culture   Final    NO GROWTH 1 DAY Performed at [facility], [address] [place], [place] [address]    Report Status PENDING  Incomplete  Resp Panel by RT-PCR (Flu A&B, Covid) Nasopharyngeal Swab     Status: Abnormal   Collection Time: 10/23/20  7:55 PM   Specimen: Nasopharyngeal Swab; Nasopharyngeal(NP) swabs in vial transport medium  Result Value Ref Range Status   SARS Coronavirus 2 by RT PCR POSITIVE (A) NEGATIVE Final    Comment: RESULT CALLED TO, READ BACK BY AND VERIFIED WITH: DR. COOK/2040PM/Oct 23 2020/SFW (NOTE) SARS-CoV-2 target nucleic acids are DETECTED.  The SARS-CoV-2 RNA is generally detectable in upper respiratory specimens during the acute phase of infection. Positive results are indicative of the presence of the identified virus, but do not rule out bacterial infection or co-infection with other pathogens not detected by the test. Clinical correlation with patient history and other diagnostic information is necessary to determine patient infection status. The expected result is Negative.  Fact Sheet for Patients: EntrepreneurPulse.com.au  Fact Sheet for Healthcare Providers: IncredibleEmployment.be  This test is not yet  approved or cleared by the [place] FDA and  has been authorized for detection and/or diagnosis of SARS-CoV-2 by FDA under an Emergency Use Authorization (EUA).  This EUA will remain in effect (meaning this test can be [place] ed) for the duration of  the COVID-19 declaration under Section 564(b)(1) of the Act, 21 U.S.C. section 360bbb-3(b)(1), unless the authorization is terminated or revoked sooner.     Influenza A by PCR NEGATIVE NEGATIVE Final   Influenza B by PCR NEGATIVE NEGATIVE Final    Comment: (NOTE) The Xpert Xpress SARS-CoV-2/FLU/RSV plus assay is intended as an aid in the diagnosis of influenza from Nasopharyngeal swab specimens and should not be used as a sole basis for treatment. Nasal washings and aspirates are unacceptable for Xpert Xpress SARS-CoV-2/FLU/RSV testing.  Fact Sheet for Patients: EntrepreneurPulse.com.au  Fact Sheet for Healthcare Providers: IncredibleEmployment.be  This test is not yet approved or cleared by the [place] and has been authorized for detection and/or diagnosis of SARS-CoV-2 by FDA under an Emergency Use Authorization (EUA). This EUA will remain in effect (meaning this test can be used) for the duration of the COVID-19 declaration under Section 564(b)(1) of the Act, 21 U.S.C. section 360bbb-3(b)(1), unless the authorization is terminated or revoked.  Performed at [facility], [address] [place], [place] [address]   Blood culture (routine x 2)     Status: None (Preliminary result)   Collection Time: 10/23/20 10:01 PM   Specimen: BLOOD  Result Value Ref Range Status   Specimen Description BLOOD RIGHT ASSIST CONTROL  Final   Special Requests   Final    BOTTLES DRAWN AEROBIC AND ANAEROBIC Blood Culture results may not be optimal due to an excessive volume of blood received in culture bottles   Culture   Final    NO GROWTH 2 DAYS Performed at [facility], [address] [place], [place] [address]    Report Status PENDING  Incomplete      Radiology Studies: DG Chest 2 View  Result Date: 10/23/2020 CLINICAL DATA:  74 year old male with cough and rhinorrhea for 4 days. EXAM: CHEST - 2 VIEW COMPARISON:  CTA chest 02/25/2016 and earlier. FINDINGS: Stable cardiomegaly and mediastinal contours. Visualized tracheal air column is within normal limits. Patchy and indistinct asymmetric lower lung opacity greater on the left. No superimposed pneumothorax or pulmonary edema. No pleural effusion. Mild respiratory motion on the lateral views. No acute osseous abnormality identified. Negative visible bowel gas pattern. IMPRESSION: Asymmetric patchy, indistinct bilateral lower lung opacity is suspicious for acute viral/atypical respiratory infection. No pleural effusion. Electronically Signed   By: Genevie Ann M.D.   On: 10/23/2020 20:29   DG Chest Port 1 View  Result Date: 10/23/2020 CLINICAL DATA:  Productive cough COVID positive EXAM: PORTABLE CHEST 1 VIEW COMPARISON:  10/23/2020 FINDINGS: Patchy bilateral mid to lower lung opacities. No pleural effusion. Mild cardiomegaly. No pneumothorax. IMPRESSION: Patchy bilateral mid to lower lung opacities suspicious for bilateral pneumonia. This does not appear significantly changed. Electronically Signed   By: Donavan Foil M.D.   On: 10/23/2020 22:12   ECHOCARDIOGRAM COMPLETE  Result Date: 10/24/2020    ECHOCARDIOGRAM REPORT   Patient Name:   [name] Date of Exam: 10/24/2020 Medical Rec #:  [id number]       Height:       70.0 in Accession #:    [id number]      Weight:       220.0 lb Date of Birth:  1946/05/31       BSA:          2.174 m Patient Age:    69 years        BP:           113/94 mmHg Patient Gender: M               HR:           93 bpm. Exam Location:  [facility] Procedure: 2D Echo, Color Doppler, Cardiac Doppler and Strain Analysis Indications:     I48.91 Atrial fibrillation  History:         Patient has no prior  history of Echocardiogram examinations.                  Risk Factors:Hypertension and Dyslipidemia. Pt tested positive  for COVID-19 on 10/23/20.  Sonographer:     Charmayne Sheer RDCS (AE) Referring Phys:  [id number] Athena Masse Diagnosing Phys: Ida Rogue MD  Sonographer Comments: Suboptimal parasternal window. Global longitudinal strain was attempted. IMPRESSIONS  1. Left ventricular ejection fraction, by estimation, is 55 to 60%. The left ventricle has normal function. The left ventricle has no regional wall motion abnormalities. Left ventricular diastolic parameters are indeterminate. The average left ventricular global longitudinal strain is -5.8 %. The global longitudinal strain is abnormal.  2. Right ventricular systolic function is normal. The right ventricular size is normal.  3. Left atrial size was mildly dilated.  4. Mild mitral valve regurgitation. FINDINGS  Left Ventricle: Left ventricular ejection fraction, by estimation, is 55 to 60%. The left ventricle has normal function. The left ventricle has no regional wall motion abnormalities. The average left ventricular global longitudinal strain is -5.8 %. The  global longitudinal strain is abnormal. The left ventricular internal cavity size was normal in size. There is no left ventricular hypertrophy. Left ventricular diastolic parameters are indeterminate. Right Ventricle: The right ventricular size is normal. No increase in right ventricular wall thickness. Right ventricular systolic function is normal. Left Atrium: Left atrial size was mildly dilated. Right Atrium: Right atrial size was normal in size. Pericardium: There is no evidence of pericardial effusion. Mitral Valve: The mitral valve is normal in structure. Mild mitral valve regurgitation. No evidence of mitral valve stenosis. MV peak gradient, 4.2 mmHg. The mean mitral valve gradient is 2.0 mmHg. Tricuspid Valve: The tricuspid valve is normal in structure. Tricuspid valve  regurgitation is mild . No evidence of tricuspid stenosis. Aortic Valve: The aortic valve is normal in structure. Aortic valve regurgitation is not visualized. No aortic stenosis is present. Aortic valve mean gradient measures 4.0 mmHg. Aortic valve peak gradient measures 6.7 mmHg. Aortic valve area, by VTI measures 3.14 cm. Pulmonic Valve: The pulmonic valve was normal in structure. Pulmonic valve regurgitation is not visualized. No evidence of pulmonic stenosis. Aorta: The aortic root is normal in size and structure. Venous: The inferior vena cava is normal in size with greater than 50% respiratory variability, suggesting right atrial pressure of 3 mmHg. IAS/Shunts: No atrial level shunt detected by color flow Doppler.  LEFT VENTRICLE PLAX 2D LVIDd:         4.90 cm     Diastology LVIDs:         4.05 cm     LV e' medial:    4.90 cm/s LV PW:         1.21 cm     LV E/e' medial:  17.0 LV IVS:        0.89 cm     LV e' lateral:   6.85 cm/s LVOT diam:     2.40 cm     LV E/e' lateral: 12.2 LV SV:         56 LV SV Index:   26          2D Longitudinal Strain LVOT Area:     4.52 cm    2D Strain GLS (A2C):   -5.0 %                            2D Strain GLS (A3C):   -3.5 %                            2D  Strain GLS (A4C):   -8.9 % LV Volumes (MOD)           2D Strain GLS Avg:     -5.8 % LV vol d, MOD A2C: 65.3 ml LV vol d, MOD A4C: 76.5 ml LV vol s, MOD A2C: 48.2 ml LV vol s, MOD A4C: 51.3 ml LV SV MOD A2C:     17.1 ml LV SV MOD A4C:     76.5 ml LV SV MOD BP:      20.7 ml RIGHT VENTRICLE RV Basal diam:  2.70 cm LEFT ATRIUM             Index       RIGHT ATRIUM           Index LA diam:        4.40 cm 2.02 cm/m  RA Area:     12.00 cm LA Vol (A2C):   41.1 ml 18.91 ml/m RA Volume:   26.30 ml  12.10 ml/m LA Vol (A4C):   71.2 ml 32.76 ml/m LA Biplane Vol: 57.0 ml 26.22 ml/m  AORTIC VALVE                   PULMONIC VALVE AV Area (Vmax):    2.98 cm    PV Vmax:       0.96 m/s AV Area (Vmean):   2.82 cm    PV Vmean:      65.500  cm/s AV Area (VTI):     3.14 cm    PV VTI:        0.117 m AV Vmax:           129.00 cm/s PV Peak grad:  3.7 mmHg AV Vmean:          93.100 cm/s PV Mean grad:  2.0 mmHg AV VTI:            0.177 m AV Peak Grad:      6.7 mmHg AV Mean Grad:      4.0 mmHg LVOT Vmax:         84.90 cm/s LVOT Vmean:        58.000 cm/s LVOT VTI:          0.123 m LVOT/AV VTI ratio: 0.69  AORTA Ao Root diam: 3.40 cm MITRAL VALVE               TRICUSPID VALVE MV Area (PHT): 6.36 cm    TR Peak grad:   25.2 mmHg MV Peak grad:  4.2 mmHg    TR Vmax:        251.00 cm/s MV Mean grad:  2.0 mmHg MV Vmax:       1.02 m/s    SHUNTS MV Vmean:      73.8 cm/s   Systemic VTI:  0.12 m MV Decel Time: 119 msec    Systemic Diam: 2.40 cm MV E velocity: 83.53 cm/s Ida Rogue MD Electronically signed by Ida Rogue MD Signature Date/Time: 10/24/2020/3:54:50 PM    Final      Scheduled Meds: . albuterol  2 puff Inhalation Q6H  . apixaban  5 mg Oral BID  . vitamin C  500 mg Oral Daily  . methylPREDNISolone (SOLU-MEDROL) injection  40 mg Intravenous Q8H  . metoprolol tartrate  25 mg Oral Once  . metoprolol tartrate  50 mg Oral BID  . zinc sulfate  220 mg Oral Daily   Continuous Infusions: . remdesivir 100 mg in NS 100 mL 100 mg (  10/25/20 0913)     LOS: 2 days     Enzo Bi, MD [facility] If 7PM-7AM, please contact night-coverage 10/25/2020, 6:23 PM

## 2020-10-24 NOTE — Consult Note (Signed)
CARDIOLOGY CONSULT NOTE               Patient ID: [name] MRN: [id number] DOB/AGE: 1945/12/09 74 y.o.  Admit date: 10/23/2020 Referring Physician Damita Dunnings Primary Physician [place] Primary Cardiologist Nehemiah Massed Reason for Consultation atrial fibrillation  HPI: 74 year old male referred for evaluation of atrial fibrillation. The patient has a history of essential hypertension, hyperlipidemia and paroxysmal atrial fibrillation, which was noted in the hospitalist's note during an admission in 2017 for pneumonia. Anticoagulation was deferred at the time with a chads vasc score of 1, and the patient has had no apparent recurrence. The patient went to urgent care yesterday complaining of a several day history of fever, runny nose, cough, with a sick contact at home.  He tested positive for Covid, and was also noted to be in atrial fibrillation with RVR with rates in the 120s.  He was taken to [facility] ER for further evaluation.  Chest x-ray revealed asymmetric patchy, indistinct bilateral lower lung opacities with no pleural effusion.  The patient has a chads vasc score of 2 (age-89, HTN-1).  Admission labs notable for normal high-sensitivity troponin of 12, followed by 11, BUN 14, creatinine 1.04, normal white count, no evidence of anemia, normal TSH, D-dimer 1018. The patient denies a history of MI, stroke, heart failure, diabetes, CKD, DVT/PE, peptic ulcer, history of brain bleed or emergent blood transfusion. Stress echocardiogram in 2019 showed normal left ventricular function at rest and at peak exercise with no ischemic ECG changes with mild tricuspid regurgitation. Currently, the patient denies chest pain or palpitations. He reports shortness of breath with prolonged coughing.  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   Squamous cell carcinoma of skin 07/09/2014   Anterior neck, In situ    Past Surgical History:  Procedure Laterality Date   CATARACT EXTRACTION     COLONOSCOPY WITH ESOPHAGOGASTRODUODENOSCOPY (EGD)     COLONOSCOPY WITH PROPOFOL N/A 01/20/2017   Procedure: COLONOSCOPY WITH PROPOFOL;  Surgeon: Manya Silvas, MD;  Location: [place];  Service: Endoscopy;  Laterality: N/A;   REPLACEMENT TOTAL KNEE Right     (Not in a hospital admission)  Social History   Socioeconomic History   Marital status: Married    Spouse name: Not on file   Number of children: Not on file   Years of education: Not on file   Highest education level: Not on file  Occupational History   Not on file  Tobacco Use   Smoking status: Never Smoker   Smokeless tobacco: Never Used  Substance and Sexual Activity   Alcohol use: No   Drug use: No   Sexual activity: Not on file  Other Topics Concern   Not on file  Social History Narrative   Not on file   Social Determinants of Health   Financial Resource Strain: Not on file  Food Insecurity: Not on file  Transportation Needs: Not on file  Physical Activity: Not on file  Stress: Not on file  Social Connections: Not on file  Intimate Partner Violence: Not on file    Family History  Problem Relation Age of Onset   Cancer Mother  Liver cancer Father    Diabetes Father       Review of systems complete and found to be negative unless listed above      PHYSICAL EXAM  General: Well developed, well nourished, mildly ill appearing, sitting up in bead, in no acute distress HEENT:  Normocephalic and atramatic Neck:  No JVD.  Lungs: normal effort of breathing on supplemental oxygen, coughing Heart: irregularly irregular Abdomen: no obvious distention Msk:  No obvious deformities Extremities: No clubbing, cyanosis or edema.    Neuro: Alert and oriented X 3. Psych:  Good affect, responds appropriately  Labs:   Lab Results  Component Value Date   WBC 9.4 10/24/2020   HGB 13.5 10/24/2020   HCT 40.8 10/24/2020   MCV 91.3 10/24/2020   PLT 160 10/24/2020    Recent Labs  Lab 10/24/20 0526  NA 134*  K 3.9  CL 100  CO2 25  BUN 13  CREATININE 1.00  CALCIUM 8.2*  PROT 7.1  BILITOT 0.7  ALKPHOS 42  ALT 19  AST 31  GLUCOSE 113*   Lab Results  Component Value Date   TROPONINI <0.03 02/25/2016   No results found for: CHOL No results found for: HDL No results found for: LDLCALC No results found for: TRIG No results found for: CHOLHDL No results found for: LDLDIRECT    Radiology: DG Chest 2 View  Result Date: 10/23/2020 CLINICAL DATA:  74 year old male with cough and rhinorrhea for 4 days. EXAM: CHEST - 2 VIEW COMPARISON:  CTA chest 02/25/2016 and earlier. FINDINGS: Stable cardiomegaly and mediastinal contours. Visualized tracheal air column is within normal limits. Patchy and indistinct asymmetric lower lung opacity greater on the left. No superimposed pneumothorax or pulmonary edema. No pleural effusion. Mild respiratory motion on the lateral views. No acute osseous abnormality identified. Negative visible bowel gas pattern. IMPRESSION: Asymmetric patchy, indistinct bilateral lower lung opacity is suspicious for acute viral/atypical respiratory infection. No pleural effusion. Electronically Signed   By: Genevie Ann M.D.   On: 10/23/2020 20:29   DG Chest Port 1 View  Result Date: 10/23/2020 CLINICAL DATA:  Productive cough COVID positive EXAM: PORTABLE CHEST 1 VIEW COMPARISON:  10/23/2020 FINDINGS: Patchy bilateral mid to lower lung opacities. No pleural effusion. Mild cardiomegaly. No pneumothorax. IMPRESSION: Patchy bilateral mid to lower lung opacities suspicious for bilateral pneumonia. This does not appear significantly changed. Electronically Signed   By: Donavan Foil M.D.   On: 10/23/2020 22:12     EKG: Atrial fibrillation  ASSESSMENT AND PLAN:  1. Atrial fibrillation with RVR, in the setting of COVID pneumonia, with a previous history of paroxysmal atrial fibrillation in the setting of pneumonia in 2017, currently with a chads vasc score of 2 (age-70, HTN-1). His rate is currently better controlled. 2. COVID pneumonia; chest x-ray shows bilateral mid to lower lung opacities 3. Essential hypertension, mildly elevated on metoprolol tartrate 25 mg twice daily  Recommendations: 1. Recommend chronic anticoagulation with Eliquis 5 mg BID 2. Continue metoprolol tartrate 25 mg BID for rate control, may uptitrate for better rate and blood pressure control 3. Review 2D echocardiogram 4. Pneumonia management per hospitalist 5. Discontinue aspirin 6. Further recommendations pending patient's initial course.  Signed: Clabe Seal PA-C 10/24/2020, 9:26 AM

## 2020-10-24 NOTE — Progress Notes (Signed)
Remdesivir - Pharmacy Brief Note    A/P:  Remdesivir 200 mg IVPB once followed by 100 mg IVPB daily x 4 days.   Hart Robinsons, PharmD Clinical Pharmacist   10/24/2020 2:27 AM

## 2020-10-24 NOTE — ED Notes (Signed)
Pt given a small cup of coffee and sweetener.

## 2020-10-24 NOTE — ED Notes (Signed)
Late entry -- Pt awake, a&ox4.  RR even and unlabored on 2L O2 via [place] (placed on suppl O2 after arrival to room due to O2 levels low 90s intermittently - pt now maintaining @95 % on 2L via [place]) -- mild crackles auscultated to posterior lower bases.  Persistent  nonprod cough improved with Tussionex administration (see MAR).  S1 and S2 irregular; pt remains in A-Fib - controlled since receiving IV Cardizem and PO Metoprolol.  Continuous cardiac monitoring maintained.  Abdomen rounded; slightly firm with BSx4 upon auscultation.  Skin warm dry and intact.  Pt now resting in bed comfortably - denies any  Immediate needs, questions, concerns.  Will monitor for acute changes and maintain plan of care as pt awaits bed assignment/

## 2020-10-24 NOTE — ED Notes (Signed)
Patient up to bedside using urinal. Became SOB and tachycardic. Vitals normalized after decreased movement.

## 2020-10-24 NOTE — Progress Notes (Signed)
PHARMACIST - PHYSICIAN COMMUNICATION  CONCERNING:  Enoxaparin (Lovenox) for DVT Prophylaxis    RECOMMENDATION: Patient was prescribed enoxaprin 40mg  q24 hours for VTE prophylaxis.   Filed Weights   10/23/20 2138  Weight: 99.8 kg (220 lb)    Body mass index is 31.57 kg/m.  Estimated Creatinine Clearance: 77.9 mL/min (by C-G formula based on SCr of 1 mg/dL).   Based on [place] patient is candidate for enoxaparin 0.5mg /kg TBW SQ every 24 hours based on BMI being >30.  DESCRIPTION: Pharmacy has adjusted enoxaparin dose per [facility] policy.  Patient is now receiving enoxaparin 50 mg every 24 hours    Ena Dawley, PharmD Clinical Pharmacist  10/24/2020 6:24 AM

## 2020-10-24 NOTE — Progress Notes (Signed)
Please call wife Vicente Males with updates per patient.

## 2020-10-24 NOTE — ED Notes (Signed)
Admitting Provider at bedside. 

## 2020-10-25 LAB — C-REACTIVE PROTEIN: CRP: 19.5 mg/dL — ABNORMAL HIGH (ref <1.0)

## 2020-10-25 LAB — BASIC METABOLIC PANEL
Anion gap: 13 (ref 5–15)
BUN: 21 mg/dL (ref 8–23)
CO2: 22 mmol/L (ref 22–32)
Calcium: 9 mg/dL (ref 8.9–10.3)
Chloride: 96 mmol/L — ABNORMAL LOW (ref 98–111)
Creatinine, Ser: 1.05 mg/dL (ref 0.61–1.24)
GFR, Estimated: >60 mL/min (ref >60)
Glucose, Bld: 135 mg/dL — ABNORMAL HIGH (ref 70–99)
Potassium: 4.5 mmol/L (ref 3.5–5.1)
Sodium: 131 mmol/L — ABNORMAL LOW (ref 135–145)

## 2020-10-25 LAB — CBC
HCT: 46.3 % (ref 39.0–52.0)
Hemoglobin: 16 g/dL (ref 13.0–17.0)
MCH: 30.9 pg (ref 26.0–34.0)
MCHC: 34.6 g/dL (ref 30.0–36.0)
MCV: 89.6 fL (ref 80.0–100.0)
Platelets: 202 10*3/uL (ref 150–400)
RBC: 5.17 MIL/uL (ref 4.22–5.81)
RDW: 13.2 % (ref 11.5–15.5)
WBC: 13.9 10*3/uL — ABNORMAL HIGH (ref 4.0–10.5)
nRBC: 0 % (ref 0.0–0.2)

## 2020-10-25 LAB — MAGNESIUM: Magnesium: 2.1 mg/dL (ref 1.7–2.4)

## 2020-10-25 MED ORDER — PANTOPRAZOLE SODIUM 40 MG PO TBEC
40.0000 mg | DELAYED_RELEASE_TABLET | Freq: Every day | ORAL | Status: DC
  Administered 2020-10-25 – 2020-10-27 (×3): 40 mg via ORAL
  Filled 2020-10-25 (×3): qty 1

## 2020-10-25 MED ORDER — ALUM & MAG HYDROXIDE-SIMETH 200-200-20 MG/5ML PO SUSP
15.0000 mL | Freq: Four times a day (QID) | ORAL | Status: DC | PRN
  Administered 2020-10-26 (×2): 15 mL via ORAL
  Filled 2020-10-25 (×2): qty 30

## 2020-10-25 MED ORDER — CALCIUM CARBONATE ANTACID 500 MG PO CHEW
1.0000 | CHEWABLE_TABLET | Freq: Three times a day (TID) | ORAL | Status: DC | PRN
  Administered 2020-10-26: 10:00:00 200 mg via ORAL
  Filled 2020-10-25: qty 1

## 2020-10-25 MED ORDER — POLYETHYLENE GLYCOL 3350 17 G PO PACK: 17.0000 g | PACK | Freq: Two times a day (BID) | ORAL | Status: DC | PRN

## 2020-10-25 MED ORDER — DOCUSATE SODIUM 100 MG PO CAPS: 100.0000 mg | ORAL_CAPSULE | Freq: Two times a day (BID) | ORAL | Status: DC | PRN

## 2020-10-25 NOTE — Progress Notes (Signed)
[facility] Cardiology    SUBJECTIVE: The patient reports feeling "good." He denies chest pain, shortness of breath, or palpitations. He reports coughing with prolonged talking.   Vitals:   10/24/20 2054 10/25/20 0028 10/25/20 0513 10/25/20 0840  BP: 111/73 106/70 106/82 104/71  Pulse: (!) 109 98 93 99  Resp: 19 19 19 18   Temp: 97.8 F (36.6 C) 98 F (36.7 C) (!) 97.4 F (36.3 C) 98.6 F (37 C)  TempSrc: Oral Oral Oral Oral  SpO2: 93% 92% 90% (!) 87%  Weight:      Height:        No intake or output data in the 24 hours ending 10/25/20 1328    PHYSICAL EXAM  General: Well developed, well nourished, in no acute distress, sitting up in bed watching TV HEENT:  Normocephalic and atramatic Neck:  No JVD.  Lungs: normal effort of breathing on supplemental oxygen via [place], speaking in full sentences Heart: irregularly irregular.  Abdomen: nondistended, soft, nontender Msk:  Back normal, gait not assessed. Normal strength and tone for age. Extremities: No clubbing, cyanosis or edema.   Neuro: Alert and oriented X 3. Psych:  Good affect, responds appropriately   LABS: Basic Metabolic Panel: Recent Labs    10/24/20 0526 10/25/20 0453  NA 134* 131*  K 3.9 4.5  CL 100 96*  CO2 25 22  GLUCOSE 113* 135*  BUN 13 21  CREATININE 1.00 1.05  CALCIUM 8.2* 9.0  MG  --  2.1   Liver Function Tests: Recent Labs    10/24/20 0526  AST 31  ALT 19  ALKPHOS 42  BILITOT 0.7  PROT 7.1  ALBUMIN 3.4*   No results for input(s): LIPASE, AMYLASE in the last 72 hours. CBC: Recent Labs    10/24/20 0526 10/25/20 0453  WBC 9.4 13.9*  NEUTROABS 7.0  --   HGB 13.5 16.0  HCT 40.8 46.3  MCV 91.3 89.6  PLT 160 202   Cardiac Enzymes: No results for input(s): CKTOTAL, CKMB, CKMBINDEX, TROPONINI in the last 72 hours. BNP: Invalid input(s): POCBNP D-Dimer: No results for input(s): DDIMER in the last 72 hours. Hemoglobin A1C: No results for input(s): HGBA1C in the last 72 hours. Fasting  Lipid Panel: No results for input(s): CHOL, HDL, LDLCALC, TRIG, CHOLHDL, LDLDIRECT in the last 72 hours. Thyroid Function Tests: Recent Labs    10/23/20 2200  TSH 1.053   Anemia Panel: Recent Labs    10/24/20 0526  FERRITIN 126    DG Chest 2 View  Result Date: 10/23/2020 CLINICAL DATA:  74 year old male with cough and rhinorrhea for 4 days. EXAM: CHEST - 2 VIEW COMPARISON:  CTA chest 02/25/2016 and earlier. FINDINGS: Stable cardiomegaly and mediastinal contours. Visualized tracheal air column is within normal limits. Patchy and indistinct asymmetric lower lung opacity greater on the left. No superimposed pneumothorax or pulmonary edema. No pleural effusion. Mild respiratory motion on the lateral views. No acute osseous abnormality identified. Negative visible bowel gas pattern. IMPRESSION: Asymmetric patchy, indistinct bilateral lower lung opacity is suspicious for acute viral/atypical respiratory infection. No pleural effusion. Electronically Signed   By: Genevie Ann M.D.   On: 10/23/2020 20:29   DG Chest Port 1 View  Result Date: 10/23/2020 CLINICAL DATA:  Productive cough COVID positive EXAM: PORTABLE CHEST 1 VIEW COMPARISON:  10/23/2020 FINDINGS: Patchy bilateral mid to lower lung opacities. No pleural effusion. Mild cardiomegaly. No pneumothorax. IMPRESSION: Patchy bilateral mid to lower lung opacities suspicious for bilateral pneumonia. This does not appear  significantly changed. Electronically Signed   By: Donavan Foil M.D.   On: 10/23/2020 22:12   ECHOCARDIOGRAM COMPLETE  Result Date: 10/24/2020    ECHOCARDIOGRAM REPORT   Patient Name:   [name] Date of Exam: 10/24/2020 Medical Rec #:  [id number]       Height:       70.0 in Accession #:    [id number]      Weight:       220.0 lb Date of Birth:  24-Mar-1946       BSA:          2.174 m Patient Age:    16 years        BP:           113/94 mmHg Patient Gender: M               HR:           93 bpm. Exam Location:  [facility] Procedure: 2D  Echo, Color Doppler, Cardiac Doppler and Strain Analysis Indications:     I48.91 Atrial fibrillation  History:         Patient has no prior history of Echocardiogram examinations.                  Risk Factors:Hypertension and Dyslipidemia. Pt tested positive                  for COVID-19 on 10/23/20.  Sonographer:     Charmayne Sheer RDCS (AE) Referring Phys:  [id number] Athena Masse Diagnosing Phys: Ida Rogue MD  Sonographer Comments: Suboptimal parasternal window. Global longitudinal strain was attempted. IMPRESSIONS  1. Left ventricular ejection fraction, by estimation, is 55 to 60%. The left ventricle has normal function. The left ventricle has no regional wall motion abnormalities. Left ventricular diastolic parameters are indeterminate. The average left ventricular global longitudinal strain is -5.8 %. The global longitudinal strain is abnormal.  2. Right ventricular systolic function is normal. The right ventricular size is normal.  3. Left atrial size was mildly dilated.  4. Mild mitral valve regurgitation. FINDINGS  Left Ventricle: Left ventricular ejection fraction, by estimation, is 55 to 60%. The left ventricle has normal function. The left ventricle has no regional wall motion abnormalities. The average left ventricular global longitudinal strain is -5.8 %. The  global longitudinal strain is abnormal. The left ventricular internal cavity size was normal in size. There is no left ventricular hypertrophy. Left ventricular diastolic parameters are indeterminate. Right Ventricle: The right ventricular size is normal. No increase in right ventricular wall thickness. Right ventricular systolic function is normal. Left Atrium: Left atrial size was mildly dilated. Right Atrium: Right atrial size was normal in size. Pericardium: There is no evidence of pericardial effusion. Mitral Valve: The mitral valve is normal in structure. Mild mitral valve regurgitation. No evidence of mitral valve stenosis. MV peak  gradient, 4.2 mmHg. The mean mitral valve gradient is 2.0 mmHg. Tricuspid Valve: The tricuspid valve is normal in structure. Tricuspid valve regurgitation is mild . No evidence of tricuspid stenosis. Aortic Valve: The aortic valve is normal in structure. Aortic valve regurgitation is not visualized. No aortic stenosis is present. Aortic valve mean gradient measures 4.0 mmHg. Aortic valve peak gradient measures 6.7 mmHg. Aortic valve area, by VTI measures 3.14 cm. Pulmonic Valve: The pulmonic valve was normal in structure. Pulmonic valve regurgitation is not visualized. No evidence of pulmonic stenosis. Aorta: The aortic root is normal in size and structure. Venous: The  inferior vena cava is normal in size with greater than 50% respiratory variability, suggesting right atrial pressure of 3 mmHg. IAS/Shunts: No atrial level shunt detected by color flow Doppler.  LEFT VENTRICLE PLAX 2D LVIDd:         4.90 cm     Diastology LVIDs:         4.05 cm     LV e' medial:    4.90 cm/s LV PW:         1.21 cm     LV E/e' medial:  17.0 LV IVS:        0.89 cm     LV e' lateral:   6.85 cm/s LVOT diam:     2.40 cm     LV E/e' lateral: 12.2 LV SV:         56 LV SV Index:   26          2D Longitudinal Strain LVOT Area:     4.52 cm    2D Strain GLS (A2C):   -5.0 %                            2D Strain GLS (A3C):   -3.5 %                            2D Strain GLS (A4C):   -8.9 % LV Volumes (MOD)           2D Strain GLS Avg:     -5.8 % LV vol d, MOD A2C: 65.3 ml LV vol d, MOD A4C: 76.5 ml LV vol s, MOD A2C: 48.2 ml LV vol s, MOD A4C: 51.3 ml LV SV MOD A2C:     17.1 ml LV SV MOD A4C:     76.5 ml LV SV MOD BP:      20.7 ml RIGHT VENTRICLE RV Basal diam:  2.70 cm LEFT ATRIUM             Index       RIGHT ATRIUM           Index LA diam:        4.40 cm 2.02 cm/m  RA Area:     12.00 cm LA Vol (A2C):   41.1 ml 18.91 ml/m RA Volume:   26.30 ml  12.10 ml/m LA Vol (A4C):   71.2 ml 32.76 ml/m LA Biplane Vol: 57.0 ml 26.22 ml/m  AORTIC VALVE                    PULMONIC VALVE AV Area (Vmax):    2.98 cm    PV Vmax:       0.96 m/s AV Area (Vmean):   2.82 cm    PV Vmean:      65.500 cm/s AV Area (VTI):     3.14 cm    PV VTI:        0.117 m AV Vmax:           129.00 cm/s PV Peak grad:  3.7 mmHg AV Vmean:          93.100 cm/s PV Mean grad:  2.0 mmHg AV VTI:            0.177 m AV Peak Grad:      6.7 mmHg AV Mean Grad:      4.0 mmHg LVOT Vmax:  84.90 cm/s LVOT Vmean:        58.000 cm/s LVOT VTI:          0.123 m LVOT/AV VTI ratio: 0.69  AORTA Ao Root diam: 3.40 cm MITRAL VALVE               TRICUSPID VALVE MV Area (PHT): 6.36 cm    TR Peak grad:   25.2 mmHg MV Peak grad:  4.2 mmHg    TR Vmax:        251.00 cm/s MV Mean grad:  2.0 mmHg MV Vmax:       1.02 m/s    SHUNTS MV Vmean:      73.8 cm/s   Systemic VTI:  0.12 m MV Decel Time: 119 msec    Systemic Diam: 2.40 cm MV E velocity: 83.53 cm/s Ida Rogue MD Electronically signed by Ida Rogue MD Signature Date/Time: 10/24/2020/3:54:50 PM    Final      Echo normal left ventricular function, LVEF 55-60%, with mild mitral regurgitation  TELEMETRY: atrial fibrillation, controlled ventricular rate  ASSESSMENT AND PLAN:  Principal Problem:   Pneumonia due to COVID-19 virus Active Problems:   Hypertension   Gout, joint   Rapid atrial fibrillation ([place])   1. Atrial fibrillation with RVR, in the setting of COVID pneumonia, with a previous history of paroxysmal atrial fibrillation in the setting of pneumonia in 2017, currently with a chads vasc score of 2 (age-5, HTN-1). His rate is currently well controlled. 2. COVID pneumonia; chest x-ray shows bilateral mid to lower lung opacities 3. Essential hypertension, well controlled on metoprolol tartrate 50 mg twice daily  Recommendations: 1. Continue Eliquis 5 mg BID for stroke prevention 2. Continue metoprolol tartrate 50 mg BID for rate and blood pressure control 3. Recommend following up with Dr. Serafina Royals about 1 week from discharge  to have Holter monitor placed.  Sign off for now; please call/haiku with any questions.   Clabe Seal, PA-C 10/25/2020 1:28 PM

## 2020-10-25 NOTE — Care Management Important Message (Signed)
Important Message  Patient Details  Name: [name] MRN: [id number] Date of Birth: 1945-11-30   Medicare Important Message Given:  N/A - LOS <3 / Initial given by admissions     [name] 10/25/2020, 8:25 AM

## 2020-10-25 NOTE — Progress Notes (Signed)
PROGRESS NOTE    LAIRD RUNNION  LGX:[id number] DOB: 28-Dec-1945 DOA: 10/23/2020 PCP: Juluis Pitch, MD    Assessment & Plan:   Principal Problem:   Pneumonia due to COVID-19 virus Active Problems:   Hypertension   Gout, joint   Rapid atrial fibrillation (HCC)    [name] is a 74 y.o. male with medical history significant for gout, HTN and HLD and arthritis who presents to the emergency room with a several day history of cough, nasal congestion decreased appetite and low-grade fever.   Acute hypoxic respiratory failure 2/2 COVID PNA --No hypoxic on presentation, however, later was found to be 88% on RA.  Was put on 2L O2. --Continue supplemental O2 to keep sats >=92%, wean as tolerated  Pneumonia due to COVID-19 virus --Chest x-ray showed patchy bilateral mid to lower lung opacities -started on Remdesivir, albuterol, antitussives and vitamins on admission, but no steroids. --CRP 15 on presentation, increased to 19.5 next day. PLAN: --cont Remdesivir --cont solumedrol 40 mg q8h --monitor and ensure CRP down-trending --albuterol q6h    New onset Rapid atrial fibrillation (HCC) -EKG with A. fib at 118 -CHA2DS2-VASc score of 1  --cardiologist consulted on admission, started on metop 25 mg BID and Eliquis PLAN: --cont metop 50 mg BID --cont Eliquis (new) --keep tele for now    Hypertension --BP low normal. --cont metop 50 mg BID --Hold home Lisinopril    Gout, joint -Chronic and stable no complaints.   DVT prophylaxis: CX:KGYJEHU Code Status: Full code  Family Communication:  Status is: inpatient Dispo:   The patient is from: home Anticipated d/c is to: home Anticipated d/c date is: 2-3 days Patient currently is not medically stable to d/c due to: covid, hypoxic, on treatment   Subjective and Interval History:  Pt reported cough but overall felt better.  No N/V/D.  No pain.   Objective: Vitals:   10/25/20 1613 10/25/20 2136 10/25/20 2206  10/26/20 0042  BP: 110/80 104/85  104/77  Pulse: 89 94  82  Resp: 16 19  20   Temp: 97.8 F (36.6 C) 98.3 F (36.8 C)  98.5 F (36.9 C)  TempSrc:      SpO2: (!) 87% (!) 89% 92% 90%  Weight:      Height:       No intake or output data in the 24 hours ending 10/26/20 0131 Filed Weights   10/23/20 2138  Weight: 99.8 kg    Examination:   Constitutional: NAD, AAOx3 HEENT: conjunctivae and lids normal, EOMI CV: RRR.  No cyanosis.   RESP: scattered crackles, on 3L Extremities: No effusions, edema in BLE SKIN: warm, dry and intact Neuro: II - XII grossly intact.   Psych: Normal mood and affect.  Appropriate judgement and reason    Data Reviewed: I have personally reviewed following labs and imaging studies  CBC: Recent Labs  Lab 10/23/20 2200 10/24/20 0526 10/25/20 0453  WBC 8.5 9.4 13.9*  NEUTROABS  --  7.0  --   HGB 14.7 13.5 16.0  HCT 43.9 40.8 46.3  MCV 91.3 91.3 89.6  PLT 199 160 314   Basic Metabolic Panel: Recent Labs  Lab 10/23/20 2200 10/24/20 0526 10/25/20 0453  NA 135 134* 131*  K 3.8 3.9 4.5  CL 99 100 96*  CO2 26 25 22   GLUCOSE 95 113* 135*  BUN 14 13 21   CREATININE 1.04 1.00 1.05  CALCIUM 8.4* 8.2* 9.0  MG  --   --  2.1  GFR: Estimated Creatinine Clearance: 74.2 mL/min (by C-G formula based on SCr of 1.05 mg/dL). Liver Function Tests: Recent Labs  Lab 10/24/20 0526  AST 31  ALT 19  ALKPHOS 42  BILITOT 0.7  PROT 7.1  ALBUMIN 3.4*   No results for input(s): LIPASE, AMYLASE in the last 168 hours. No results for input(s): AMMONIA in the last 168 hours. Coagulation Profile: No results for input(s): INR, PROTIME in the last 168 hours. Cardiac Enzymes: No results for input(s): CKTOTAL, CKMB, CKMBINDEX, TROPONINI in the last 168 hours. BNP (last 3 results) No results for input(s): PROBNP in the last 8760 hours. HbA1C: No results for input(s): HGBA1C in the last 72 hours. CBG: No results for input(s): GLUCAP in the last 168  hours. Lipid Profile: No results for input(s): CHOL, HDL, LDLCALC, TRIG, CHOLHDL, LDLDIRECT in the last 72 hours. Thyroid Function Tests: Recent Labs    10/23/20 2200  TSH 1.053   Anemia Panel: Recent Labs    10/24/20 0526  FERRITIN 126   Sepsis Labs: Recent Labs  Lab 10/23/20 2200 10/24/20 0037  LATICACIDVEN 1.5 1.1    Recent Results (from the past 240 hour(s))  Blood culture (routine x 2)     Status: None (Preliminary result)   Collection Time: 10/23/20 12:37 AM   Specimen: BLOOD  Result Value Ref Range Status   Specimen Description BLOOD LEFT ANTECUBITAL  Final   Special Requests   Final    BOTTLES DRAWN AEROBIC AND ANAEROBIC Blood Culture adequate volume   Culture   Final    NO GROWTH 1 DAY Performed at [facility], [address], [place], [place] [address]    Report Status PENDING  Incomplete  Resp Panel by RT-PCR (Flu A&B, Covid) Nasopharyngeal Swab     Status: Abnormal   Collection Time: 10/23/20  7:55 PM   Specimen: Nasopharyngeal Swab; Nasopharyngeal(NP) swabs in vial transport medium  Result Value Ref Range Status   SARS Coronavirus 2 by RT PCR POSITIVE (A) NEGATIVE Final    Comment: RESULT CALLED TO, READ BACK BY AND VERIFIED WITH: DR. COOK/2040PM/Oct 23 2020/SFW (NOTE) SARS-CoV-2 target nucleic acids are DETECTED.  The SARS-CoV-2 RNA is generally detectable in upper respiratory specimens during the acute phase of infection. Positive results are indicative of the presence of the identified virus, but do not rule out bacterial infection or co-infection with other pathogens not detected by the test. Clinical correlation with patient history and other diagnostic information is necessary to determine patient infection status. The expected result is Negative.  Fact Sheet for Patients: EntrepreneurPulse.com.au  Fact Sheet for Healthcare Providers: IncredibleEmployment.be  This test is not yet approved or  cleared by the [place] FDA and  has been authorized for detection and/or diagnosis of SARS-CoV-2 by FDA under an Emergency Use Authorization (EUA).  This EUA will remain in effect (meaning this test can be [place] ed) for the duration of  the COVID-19 declaration under Section 564(b)(1) of the Act, 21 U.S.C. section 360bbb-3(b)(1), unless the authorization is terminated or revoked sooner.     Influenza A by PCR NEGATIVE NEGATIVE Final   Influenza B by PCR NEGATIVE NEGATIVE Final    Comment: (NOTE) The Xpert Xpress SARS-CoV-2/FLU/RSV plus assay is intended as an aid in the diagnosis of influenza from Nasopharyngeal swab specimens and should not be used as a sole basis for treatment. Nasal washings and aspirates are unacceptable for Xpert Xpress SARS-CoV-2/FLU/RSV testing.  Fact Sheet for Patients: EntrepreneurPulse.com.au  Fact Sheet for Healthcare  Providers: IncredibleEmployment.be  This test is not yet approved or cleared by the [place] and has been authorized for detection and/or diagnosis of SARS-CoV-2 by FDA under an Emergency Use Authorization (EUA). This EUA will remain in effect (meaning this test can be used) for the duration of the COVID-19 declaration under Section 564(b)(1) of the Act, 21 U.S.C. section 360bbb-3(b)(1), unless the authorization is terminated or revoked.  Performed at [facility], [address], [place], [place] [address]   Blood culture (routine x 2)     Status: None (Preliminary result)   Collection Time: 10/23/20 10:01 PM   Specimen: BLOOD  Result Value Ref Range Status   Specimen Description BLOOD RIGHT ASSIST CONTROL  Final   Special Requests   Final    BOTTLES DRAWN AEROBIC AND ANAEROBIC Blood Culture results may not be optimal due to an excessive volume of blood received in culture bottles   Culture   Final    NO GROWTH 2 DAYS Performed at [facility], [address], [place], [place] [address]    Report Status PENDING  Incomplete      Radiology Studies: ECHOCARDIOGRAM COMPLETE  Result Date: 10/24/2020    ECHOCARDIOGRAM REPORT   Patient Name:   ARDELL MAKAREWICZ Date of Exam: 10/24/2020 Medical Rec #:  [id number]       Height:       70.0 in Accession #:    [id number]      Weight:       220.0 lb Date of Birth:  07-20-1946       BSA:          2.174 m Patient Age:    30 years        BP:           113/94 mmHg Patient Gender: M               HR:           93 bpm. Exam Location:  [facility] Procedure: 2D Echo, Color Doppler, Cardiac Doppler and Strain Analysis Indications:     I48.91 Atrial fibrillation  History:         Patient has no prior history of Echocardiogram examinations.                  Risk Factors:Hypertension and Dyslipidemia. Pt tested positive                  for COVID-19 on 10/23/20.  Sonographer:     Charmayne Sheer RDCS (AE) Referring Phys:  [id number] Athena Masse Diagnosing Phys: Ida Rogue MD  Sonographer Comments: Suboptimal parasternal window. Global longitudinal strain was attempted. IMPRESSIONS  1. Left ventricular ejection fraction, by estimation, is 55 to 60%. The left ventricle has normal function. The left ventricle has no regional wall motion abnormalities. Left ventricular diastolic parameters are indeterminate. The average left ventricular global longitudinal strain is -5.8 %. The global longitudinal strain is abnormal.  2. Right ventricular systolic function is normal. The right ventricular size is normal.  3. Left atrial size was mildly dilated.  4. Mild mitral valve regurgitation. FINDINGS  Left Ventricle: Left ventricular ejection fraction, by estimation, is 55 to 60%. The left ventricle has normal function. The left ventricle has no regional wall motion abnormalities. The average left ventricular global longitudinal strain is -5.8 %. The  global longitudinal strain is abnormal. The left ventricular internal cavity size was normal in size. There is no  left ventricular hypertrophy. Left ventricular diastolic parameters are indeterminate. Right Ventricle: The right ventricular size is normal. No increase in right ventricular wall thickness. Right ventricular systolic function is normal. Left Atrium: Left atrial size was mildly dilated. Right Atrium: Right atrial size was normal in size. Pericardium: There is no evidence of pericardial effusion. Mitral Valve: The mitral valve is normal in structure. Mild mitral valve regurgitation. No evidence of mitral valve stenosis. MV peak gradient, 4.2 mmHg. The mean mitral valve gradient is 2.0 mmHg. Tricuspid Valve: The tricuspid valve is normal in structure. Tricuspid valve regurgitation is mild . No evidence of tricuspid stenosis. Aortic Valve: The aortic valve is normal in structure. Aortic valve regurgitation is not visualized. No aortic stenosis is present. Aortic valve mean gradient measures 4.0 mmHg. Aortic valve peak gradient measures 6.7 mmHg. Aortic valve area, by VTI measures 3.14 cm. Pulmonic Valve: The pulmonic valve was normal in structure. Pulmonic valve regurgitation is not visualized. No evidence of pulmonic stenosis. Aorta: The aortic root is normal in size and structure. Venous: The inferior vena cava is normal in size with greater than 50% respiratory variability, suggesting right atrial pressure of 3 mmHg. IAS/Shunts: No atrial level shunt detected by color flow Doppler.  LEFT VENTRICLE PLAX 2D LVIDd:         4.90 cm     Diastology LVIDs:         4.05 cm     LV e' medial:    4.90 cm/s LV PW:         1.21 cm     LV E/e' medial:  17.0 LV IVS:        0.89 cm     LV e' lateral:   6.85 cm/s LVOT diam:     2.40 cm     LV E/e' lateral: 12.2 LV SV:         56 LV SV Index:   26          2D Longitudinal Strain LVOT Area:     4.52 cm    2D Strain GLS (A2C):   -5.0 %                            2D Strain GLS (A3C):   -3.5 %                            2D Strain GLS (A4C):   -8.9 % LV Volumes (MOD)           2D Strain  GLS Avg:     -5.8 % LV vol d, MOD A2C: 65.3 ml LV vol d, MOD A4C: 76.5 ml LV vol s, MOD A2C: 48.2 ml LV vol s, MOD A4C: 51.3 ml LV SV MOD A2C:     17.1 ml LV SV MOD A4C:     76.5 ml LV SV MOD BP:      20.7 ml RIGHT VENTRICLE RV Basal diam:  2.70 cm LEFT ATRIUM             Index       RIGHT ATRIUM           Index LA diam:        4.40 cm 2.02 cm/m  RA Area:     12.00 cm LA Vol (A2C):   41.1 ml 18.91 ml/m RA Volume:   26.30 ml  12.10 ml/m LA Vol (A4C):  71.2 ml 32.76 ml/m LA Biplane Vol: 57.0 ml 26.22 ml/m  AORTIC VALVE                   PULMONIC VALVE AV Area (Vmax):    2.98 cm    PV Vmax:       0.96 m/s AV Area (Vmean):   2.82 cm    PV Vmean:      65.500 cm/s AV Area (VTI):     3.14 cm    PV VTI:        0.117 m AV Vmax:           129.00 cm/s PV Peak grad:  3.7 mmHg AV Vmean:          93.100 cm/s PV Mean grad:  2.0 mmHg AV VTI:            0.177 m AV Peak Grad:      6.7 mmHg AV Mean Grad:      4.0 mmHg LVOT Vmax:         84.90 cm/s LVOT Vmean:        58.000 cm/s LVOT VTI:          0.123 m LVOT/AV VTI ratio: 0.69  AORTA Ao Root diam: 3.40 cm MITRAL VALVE               TRICUSPID VALVE MV Area (PHT): 6.36 cm    TR Peak grad:   25.2 mmHg MV Peak grad:  4.2 mmHg    TR Vmax:        251.00 cm/s MV Mean grad:  2.0 mmHg MV Vmax:       1.02 m/s    SHUNTS MV Vmean:      73.8 cm/s   Systemic VTI:  0.12 m MV Decel Time: 119 msec    Systemic Diam: 2.40 cm MV E velocity: 83.53 cm/s Ida Rogue MD Electronically signed by Ida Rogue MD Signature Date/Time: 10/24/2020/3:54:50 PM    Final      Scheduled Meds: . albuterol  2 puff Inhalation Q6H  . apixaban  5 mg Oral BID  . vitamin C  500 mg Oral Daily  . methylPREDNISolone (SOLU-MEDROL) injection  40 mg Intravenous Q8H  . metoprolol tartrate  25 mg Oral Once  . metoprolol tartrate  50 mg Oral BID  . pantoprazole  40 mg Oral Daily  . zinc sulfate  220 mg Oral Daily   Continuous Infusions: . remdesivir 100 mg in NS 100 mL 100 mg (10/25/20 0913)     LOS:  3 days     Enzo Bi, MD [facility] If 7PM-7AM, please contact night-coverage 10/26/2020, 1:31 AM

## 2020-10-26 LAB — BASIC METABOLIC PANEL
Anion gap: 9 (ref 5–15)
BUN: 32 mg/dL — ABNORMAL HIGH (ref 8–23)
CO2: 21 mmol/L — ABNORMAL LOW (ref 22–32)
Calcium: 8.5 mg/dL — ABNORMAL LOW (ref 8.9–10.3)
Chloride: 96 mmol/L — ABNORMAL LOW (ref 98–111)
Creatinine, Ser: 1.04 mg/dL (ref 0.61–1.24)
GFR, Estimated: >60 mL/min (ref >60)
Glucose, Bld: 131 mg/dL — ABNORMAL HIGH (ref 70–99)
Potassium: 4.5 mmol/L (ref 3.5–5.1)
Sodium: 126 mmol/L — ABNORMAL LOW (ref 135–145)

## 2020-10-26 LAB — CBC
HCT: 38.9 % — ABNORMAL LOW (ref 39.0–52.0)
Hemoglobin: 13.7 g/dL (ref 13.0–17.0)
MCH: 31.1 pg (ref 26.0–34.0)
MCHC: 35.2 g/dL (ref 30.0–36.0)
MCV: 88.2 fL (ref 80.0–100.0)
Platelets: 215 10*3/uL (ref 150–400)
RBC: 4.41 MIL/uL (ref 4.22–5.81)
RDW: 13.1 % (ref 11.5–15.5)
WBC: 22.2 10*3/uL — ABNORMAL HIGH (ref 4.0–10.5)
nRBC: 0 % (ref 0.0–0.2)

## 2020-10-26 LAB — MAGNESIUM: Magnesium: 2 mg/dL (ref 1.7–2.4)

## 2020-10-26 LAB — C-REACTIVE PROTEIN: CRP: 9.1 mg/dL — ABNORMAL HIGH (ref <1.0)

## 2020-10-26 MED ORDER — DEXAMETHASONE 4 MG PO TABS
6.0000 mg | ORAL_TABLET | Freq: Every day | ORAL | Status: DC
  Administered 2020-10-26 – 2020-10-27 (×2): 6 mg via ORAL
  Filled 2020-10-26 (×2): qty 2

## 2020-10-26 NOTE — Progress Notes (Signed)
PROGRESS NOTE    [name]  ZSW:[id number] DOB: 07/06/1946 DOA: 10/23/2020 PCP: Juluis Pitch, MD    Assessment & Plan:   Principal Problem:   Pneumonia due to COVID-19 virus Active Problems:   Hypertension   Gout, joint   Rapid atrial fibrillation (HCC)    [name] is a 74 y.o. male with medical history significant for gout, HTN and HLD and arthritis who presents to the emergency room with a several day history of cough, nasal congestion decreased appetite and low-grade fever.   Acute hypoxic respiratory failure 2/2 COVID PNA --No hypoxic on presentation, however, later was found to be 88% on RA.  Was put on 2L O2. --Continue supplemental O2 to keep sats >=92%, wean as tolerated --will do ambulation O2 sats test before discharge.  Pneumonia due to COVID-19 virus --Chest x-ray showed patchy bilateral mid to lower lung opacities -started on Remdesivir, albuterol, antitussives and vitamins on admission, but no steroids. --CRP 15 on presentation, increased to 19.5 next day. PLAN: --cont Remdesivir --taper from solumedrol to oral decadron --monitor and ensure CRP down-trending --albuterol q6h    New onset Rapid atrial fibrillation (HCC) -EKG with A. fib at 118 -CHA2DS2-VASc score of 1  --cardiologist consulted on admission, started on metop 25 mg BID and Eliquis PLAN: --cont metop 50 mg BID --cont Eliquis (new) --keep tele for now    Hypertension --BP low normal. --cont metop 50 mg BID (new) --Hold home Lisinopril    Gout, joint -Chronic and stable no complaints.   DVT prophylaxis: [id number] Code Status: Full code  Family Communication:  Status is: inpatient Dispo:   The patient is from: home Anticipated d/c is to: home Anticipated d/c date is: tomorrow Patient currently is not medically stable to d/c due to: covid, hypoxic, on treatment.  Will discharge if weaned off O2 tomorrow.   Subjective and Interval History:  Pt reported cough  still, but felt better and had walked.  Pt wanted to go home, however, nursing noted O2 desat and pt was put back on supplemental O2.  No N/V/D.   Objective: Vitals:   10/26/20 0801 10/26/20 1127 10/26/20 1433 10/26/20 1612  BP: 108/76 113/80  104/71  Pulse: 77 76 82 75  Resp: 18 16  18   Temp: (!) 97.4 F (36.3 C) 97.7 F (36.5 C)  98.3 F (36.8 C)  TempSrc:      SpO2: 90% 97% 90% (!) 89%  Weight:      Height:       No intake or output data in the 24 hours ending 10/26/20 1751 Filed Weights   10/23/20 2138  Weight: 99.8 kg    Examination:   Constitutional: NAD, AAOx3, sitting in chair HEENT: conjunctivae and lids normal, EOMI CV: No cyanosis.   RESP: crackles over posterior bases, on 2L Extremities: No effusions, edema in BLE SKIN: warm, dry and intact Neuro: II - XII grossly intact.   Psych: Normal mood and affect.  Appropriate judgement and reason   Data Reviewed: I have personally reviewed following labs and imaging studies  CBC: Recent Labs  Lab 10/23/20 2200 10/24/20 0526 10/25/20 0453 10/26/20 0355  WBC 8.5 9.4 13.9* 22.2*  NEUTROABS  --  7.0  --   --   HGB 14.7 13.5 16.0 13.7  HCT 43.9 40.8 46.3 38.9*  MCV 91.3 91.3 89.6 88.2  PLT 199 160 202 062   Basic Metabolic Panel: Recent Labs  Lab 10/23/20 2200 10/24/20 0526 10/25/20 0453 10/26/20 0355  NA 135 134* 131* 126*  K 3.8 3.9 4.5 4.5  CL 99 100 96* 96*  CO2 26 25 22  21*  GLUCOSE 95 113* 135* 131*  BUN 14 13 21  32*  CREATININE 1.04 1.00 1.05 1.04  CALCIUM 8.4* 8.2* 9.0 8.5*  MG  --   --  2.1 2.0   GFR: Estimated Creatinine Clearance: 74.9 mL/min (by C-G formula based on SCr of 1.04 mg/dL). Liver Function Tests: Recent Labs  Lab 10/24/20 0526  AST 31  ALT 19  ALKPHOS 42  BILITOT 0.7  PROT 7.1  ALBUMIN 3.4*   No results for input(s): LIPASE, AMYLASE in the last 168 hours. No results for input(s): AMMONIA in the last 168 hours. Coagulation Profile: No results for input(s): INR,  PROTIME in the last 168 hours. Cardiac Enzymes: No results for input(s): CKTOTAL, CKMB, CKMBINDEX, TROPONINI in the last 168 hours. BNP (last 3 results) No results for input(s): PROBNP in the last 8760 hours. HbA1C: No results for input(s): HGBA1C in the last 72 hours. CBG: No results for input(s): GLUCAP in the last 168 hours. Lipid Profile: No results for input(s): CHOL, HDL, LDLCALC, TRIG, CHOLHDL, LDLDIRECT in the last 72 hours. Thyroid Function Tests: Recent Labs    10/23/20 2200  TSH 1.053   Anemia Panel: Recent Labs    10/24/20 0526  FERRITIN 126   Sepsis Labs: Recent Labs  Lab 10/23/20 2200 10/24/20 0037  LATICACIDVEN 1.5 1.1    Recent Results (from the past 240 hour(s))  Blood culture (routine x 2)     Status: None (Preliminary result)   Collection Time: 10/23/20 12:37 AM   Specimen: BLOOD  Result Value Ref Range Status   Specimen Description BLOOD LEFT ANTECUBITAL  Final   Special Requests   Final    BOTTLES DRAWN AEROBIC AND ANAEROBIC Blood Culture adequate volume   Culture   Final    NO GROWTH 2 DAYS Performed at [facility], [address], [place], [place] [address]    Report Status PENDING  Incomplete  Resp Panel by RT-PCR (Flu A&B, Covid) Nasopharyngeal Swab     Status: Abnormal   Collection Time: 10/23/20  7:55 PM   Specimen: Nasopharyngeal Swab; Nasopharyngeal(NP) swabs in vial transport medium  Result Value Ref Range Status   SARS Coronavirus 2 by RT PCR POSITIVE (A) NEGATIVE Final    Comment: RESULT CALLED TO, READ BACK BY AND VERIFIED WITH: DR. COOK/2040PM/Oct 23 2020/SFW (NOTE) SARS-CoV-2 target nucleic acids are DETECTED.  The SARS-CoV-2 RNA is generally detectable in upper respiratory specimens during the acute phase of infection. Positive results are indicative of the presence of the identified virus, but do not rule out bacterial infection or co-infection with other pathogens not detected by the test. Clinical correlation  with patient history and other diagnostic information is necessary to determine patient infection status. The expected result is Negative.  Fact Sheet for Patients: EntrepreneurPulse.com.au  Fact Sheet for Healthcare Providers: IncredibleEmployment.be  This test is not yet approved or cleared by the [place] FDA and  has been authorized for detection and/or diagnosis of SARS-CoV-2 by FDA under an Emergency Use Authorization (EUA).  This EUA will remain in effect (meaning this test can be [place] ed) for the duration of  the COVID-19 declaration under Section 564(b)(1) of the Act, 21 U.S.C. section 360bbb-3(b)(1), unless the authorization is terminated or revoked sooner.     Influenza A by PCR NEGATIVE NEGATIVE Final   Influenza B by PCR NEGATIVE NEGATIVE  Final    Comment: (NOTE) The Xpert Xpress SARS-CoV-2/FLU/RSV plus assay is intended as an aid in the diagnosis of influenza from Nasopharyngeal swab specimens and should not be used as a sole basis for treatment. Nasal washings and aspirates are unacceptable for Xpert Xpress SARS-CoV-2/FLU/RSV testing.  Fact Sheet for Patients: EntrepreneurPulse.com.au  Fact Sheet for Healthcare Providers: IncredibleEmployment.be  This test is not yet approved or cleared by the [place] FDA and has been authorized for detection and/or diagnosis of SARS-CoV-2 by FDA under an Emergency Use Authorization (EUA). This EUA will remain in effect (meaning this test can be used) for the duration of the COVID-19 declaration under Section 564(b)(1) of the Act, 21 U.S.C. section 360bbb-3(b)(1), unless the authorization is terminated or revoked.  Performed at [facility], [address] [place], [place] [address]   Blood culture (routine x 2)     Status: None (Preliminary result)   Collection Time: 10/23/20 10:01 PM   Specimen: BLOOD  Result Value Ref Range  Status   Specimen Description BLOOD RIGHT ASSIST CONTROL  Final   Special Requests   Final    BOTTLES DRAWN AEROBIC AND ANAEROBIC Blood Culture results may not be optimal due to an excessive volume of blood received in culture bottles   Culture   Final    NO GROWTH 3 DAYS Performed at [facility], [address] [place], [place] [address]    Report Status PENDING  Incomplete      Radiology Studies: No results found.   Scheduled Meds: . albuterol  2 puff Inhalation Q6H  . apixaban  5 mg Oral BID  . vitamin C  500 mg Oral Daily  . dexamethasone  6 mg Oral Daily  . metoprolol tartrate  25 mg Oral Once  . metoprolol tartrate  50 mg Oral BID  . pantoprazole  40 mg Oral Daily  . zinc sulfate  220 mg Oral Daily   Continuous Infusions: . remdesivir 100 mg in NS 100 mL 100 mg (10/26/20 1016)     LOS: 3 days     Enzo Bi, MD [facility] If 7PM-7AM, please contact night-coverage 10/26/2020, 5:51 PM

## 2020-10-27 LAB — CBC
HCT: 39.5 % (ref 39.0–52.0)
Hemoglobin: 13.4 g/dL (ref 13.0–17.0)
MCH: 30.2 pg (ref 26.0–34.0)
MCHC: 33.9 g/dL (ref 30.0–36.0)
MCV: 89.2 fL (ref 80.0–100.0)
Platelets: 244 10*3/uL (ref 150–400)
RBC: 4.43 MIL/uL (ref 4.22–5.81)
RDW: 13.1 % (ref 11.5–15.5)
WBC: 18.5 10*3/uL — ABNORMAL HIGH (ref 4.0–10.5)
nRBC: 0 % (ref 0.0–0.2)

## 2020-10-27 LAB — BASIC METABOLIC PANEL
Anion gap: 9 (ref 5–15)
BUN: 40 mg/dL — ABNORMAL HIGH (ref 8–23)
CO2: 26 mmol/L (ref 22–32)
Calcium: 8.5 mg/dL — ABNORMAL LOW (ref 8.9–10.3)
Chloride: 95 mmol/L — ABNORMAL LOW (ref 98–111)
Creatinine, Ser: 1.37 mg/dL — ABNORMAL HIGH (ref 0.61–1.24)
GFR, Estimated: 54 mL/min — ABNORMAL LOW (ref >60)
Glucose, Bld: 122 mg/dL — ABNORMAL HIGH (ref 70–99)
Potassium: 4.7 mmol/L (ref 3.5–5.1)
Sodium: 130 mmol/L — ABNORMAL LOW (ref 135–145)

## 2020-10-27 LAB — MAGNESIUM: Magnesium: 2.3 mg/dL (ref 1.7–2.4)

## 2020-10-27 LAB — C-REACTIVE PROTEIN: CRP: 5.6 mg/dL — ABNORMAL HIGH (ref <1.0)

## 2020-10-27 LAB — GLUCOSE, CAPILLARY: Glucose-Capillary: 153 mg/dL — ABNORMAL HIGH (ref 70–99)

## 2020-10-27 MED ORDER — ASCORBIC ACID 500 MG PO TABS: 500.0000 mg | ORAL_TABLET | Freq: Every day | ORAL | 0 refills | Status: AC

## 2020-10-27 MED ORDER — DEXAMETHASONE 6 MG PO TABS: 6.0000 mg | ORAL_TABLET | Freq: Every day | ORAL | 0 refills | Status: AC

## 2020-10-27 MED ORDER — LISINOPRIL 20 MG PO TABS: ORAL_TABLET | ORAL | 0 refills | Status: AC

## 2020-10-27 MED ORDER — METOPROLOL TARTRATE 50 MG PO TABS: 50.0000 mg | ORAL_TABLET | Freq: Two times a day (BID) | ORAL | 2 refills | Status: AC

## 2020-10-27 MED ORDER — APIXABAN 5 MG PO TABS: 5.0000 mg | ORAL_TABLET | Freq: Two times a day (BID) | ORAL | 2 refills | Status: AC

## 2020-10-27 MED ORDER — GUAIFENESIN-DM 100-10 MG/5ML PO SYRP: 10.0000 mL | ORAL_SOLUTION | ORAL | 0 refills | Status: AC | PRN

## 2020-10-27 MED ORDER — ZINC SULFATE 220 (50 ZN) MG PO CAPS: 220.0000 mg | ORAL_CAPSULE | Freq: Every day | ORAL | 0 refills | Status: AC

## 2020-10-27 MED ORDER — ALBUTEROL SULFATE HFA 108 (90 BASE) MCG/ACT IN AERS: 2.0000 | INHALATION_SPRAY | Freq: Four times a day (QID) | RESPIRATORY_TRACT | Status: DC

## 2020-10-27 NOTE — Progress Notes (Signed)
SATURATION QUALIFICATIONS: (This note is used to comply with regulatory documentation for home oxygen)  Patient Saturations on Room Air at Rest = 94%  Patient Saturations on Room Air while Ambulating = 92%  Patient Saturations on 0 Liters of oxygen while Ambulating = 90-92%  Please briefly explain why patient needs home oxygen:

## 2020-10-27 NOTE — Discharge Summary (Signed)
Physician Discharge Summary   [name]  male DOB: [birth year]  FVC:[id number]  PCP: Nathaniel Pitch, MD  Admit date: 10/23/2020 Discharge date: 10/27/2020  Admitted From: home Disposition:  home CODE STATUS: Full code  Discharge Instructions    Amb referral to [facility]   Complete by: As directed    Discharge instructions   Complete by: As directed    You have COVID pneumonia that has improved and you don't need extra oxygen anymore.    Please continue to take steroid Decadron for 5 more days as directed to reduce inflammation.  Please follow up with [name] in clinic about 1 week from discharge to have Holter monitor placed (for heart monitoring).   [name] Course:  For full details, please see H&P, progress notes, consult notes and ancillary notes.  Briefly,  [name] a 74 y.o.malewith medical history significant forgout, HTN and HLD and arthritis who presented to the emergency room with a several day history of cough, nasal congestion decreased appetite and low-grade fever.   Acute hypoxic respiratory failure 2/2 COVID PNA No hypoxic on presentation, however, later was found to be 88% on RA.  Was put on 2L O2.  Prior to discharge, pt was able to maintain 92% on room air while ambulating.  Pneumonia due to COVID-19 virus Chest x-ray showed patchy bilateral mid to lower lung opacities.  Pt was started on Remdesivir, albuterol, antitussives and vitamins on admission, but no steroids.  CRP 15 on presentation, increased to 19.5 next day, so pt was started on solumedrol 40 mg q8h resulting in CRP trending down to 5.6 prior to discharge.  Pt was discharged on 5 more days of oral decadron.  Pt received 4 doses of Remdesivir.  On the day of discharge, pt felt well and eager to go home.  New onsetRapid atrial fibrillation ([place]) On presentation, EKG with A. fib at 118.  cardiologist consulted on admission, and  started pt on metop 25 mg BID and Eliquis.  Metop was increased to 50 mg BID for better control of heart rate.  Pt was instructed to follow up with [name] in clinic about 1 week from discharge to have Holter monitor placed.    Hypertension BP low normal.  Since pt was started on metop 50 mg BID for heart rate control, home Lisinopril was held to avoid dropping BP too low.  Gout, joint Chronic and stable no complaints.   Discharge Diagnoses:  Principal Problem:   Pneumonia due to COVID-19 virus Active Problems:   Hypertension   Gout, joint   Rapid atrial fibrillation [facility])    Discharge Instructions:  Allergies as of 10/27/2020      Reactions   Lipitor [atorvastatin] Other (See Comments)   Bone and muscle pain.   Sulfa Antibiotics Hives, Rash   Zocor [simvastatin]       Medication List    STOP taking these medications   aspirin EC 81 MG tablet   ibuprofen 200 MG tablet Commonly known as: ADVIL   sildenafil 20 MG tablet Commonly known as: REVATIO     TAKE these medications   albuterol 108 (90 Base) MCG/ACT inhaler Commonly known as: VENTOLIN HFA Inhale 2 puffs into the lungs every 6 (six) hours for 7 days.   apixaban 5 MG Tabs tablet Commonly known as: ELIQUIS Take 1 tablet (5 mg total) by mouth 2 (two) times daily. New blood  thinner ordered by cardiology.   ascorbic acid 500 MG tablet Commonly known as: VITAMIN C Take 1 tablet (500 mg total) by mouth daily. Start taking on: October 28, 2020   dexamethasone 6 MG tablet Commonly known as: DECADRON Take 1 tablet (6 mg total) by mouth daily for 5 days. Steroid to reduce inflammation. Start taking on: October 28, 2020   guaiFENesin-dextromethorphan 100-10 MG/5ML syrup Commonly known as: ROBITUSSIN DM Take 10 mLs by mouth every 4 (four) hours as needed for cough.   lisinopril 20 MG tablet Commonly known as: ZESTRIL Hold until followup with outpatient doctor because your blood pressure has  been low normal without it. What changed:   how much to take  how to take this  when to take this  additional instructions   lovastatin 40 MG tablet Commonly known as: MEVACOR Take 1 tablet by mouth every evening.   metoprolol tartrate 50 MG tablet Commonly known as: LOPRESSOR Take 1 tablet (50 mg total) by mouth 2 (two) times daily. To control your heart rate.   multivitamin with minerals Tabs tablet Take 1 tablet by mouth daily.   omeprazole 20 MG capsule Commonly known as: PRILOSEC Take 1 capsule by mouth daily.   zinc sulfate 220 (50 Zn) MG capsule Take 1 capsule (220 mg total) by mouth daily. Start taking on: October 28, 2020        Follow-up Information    Nathaniel Pitch, MD. Schedule an appointment as soon as possible for a visit in 1 week(s).   Specialty: Family Medicine Contact information: [place] [address] [phone]        Nathaniel Skains, MD. Schedule an appointment as soon as possible for a visit in 1 week(s).   Specialty: Cardiology Why: placement of heart monitor Contact information: [address] [facility] [place] [address] [phone]               Allergies  Allergen Reactions  . Lipitor [Atorvastatin] Other (See Comments)    Bone and muscle pain.  . Sulfa Antibiotics Hives and Rash  . Zocor [Simvastatin]      The results of significant diagnostics from this hospitalization (including imaging, microbiology, ancillary and laboratory) are listed below for reference.   Consultations:   Procedures/Studies: DG Chest 2 View  Result Date: 10/23/2020 CLINICAL DATA:  74 year old male with cough and rhinorrhea for 4 days. EXAM: CHEST - 2 VIEW COMPARISON:  CTA chest 02/25/2016 and earlier. FINDINGS: Stable cardiomegaly and mediastinal contours. Visualized tracheal air column is within normal limits. Patchy and indistinct asymmetric lower lung opacity greater on the left.  No superimposed pneumothorax or pulmonary edema. No pleural effusion. Mild respiratory motion on the lateral views. No acute osseous abnormality identified. Negative visible bowel gas pattern. IMPRESSION: Asymmetric patchy, indistinct bilateral lower lung opacity is suspicious for acute viral/atypical respiratory infection. No pleural effusion. Electronically Signed   By: Genevie Ann M.D.   On: 10/23/2020 20:29   DG Chest Port 1 View  Result Date: 10/23/2020 CLINICAL DATA:  Productive cough COVID positive EXAM: PORTABLE CHEST 1 VIEW COMPARISON:  10/23/2020 FINDINGS: Patchy bilateral mid to lower lung opacities. No pleural effusion. Mild cardiomegaly. No pneumothorax. IMPRESSION: Patchy bilateral mid to lower lung opacities suspicious for bilateral pneumonia. This does not appear significantly changed. Electronically Signed   By: Donavan Foil M.D.   On: 10/23/2020 22:12   ECHOCARDIOGRAM COMPLETE  Result Date: 10/24/2020    ECHOCARDIOGRAM REPORT   Patient  Name:   [name] Date of Exam: 10/24/2020 Medical Rec #:  [id number]       Height:       70.0 in Accession #:    [id number]      Weight:       220.0 lb Date of Birth:  05-16-1946       BSA:          2.174 m Patient Age:    6 years        BP:           113/94 mmHg Patient Gender: M               HR:           93 bpm. Exam Location:  [facility] Procedure: 2D Echo, Color Doppler, Cardiac Doppler and Strain Analysis Indications:     I48.91 Atrial fibrillation  History:         Patient has no prior history of Echocardiogram examinations.                  Risk Factors:Hypertension and Dyslipidemia. Pt tested positive                  for COVID-19 on 10/23/20.  Sonographer:     Charmayne Sheer RDCS (AE) Referring Phys:  [id number] Athena Masse Diagnosing Phys: Ida Rogue MD  Sonographer Comments: Suboptimal parasternal window. Global longitudinal strain was attempted. IMPRESSIONS  1. Left ventricular ejection fraction, by estimation, is 55 to 60%. The left ventricle has  normal function. The left ventricle has no regional wall motion abnormalities. Left ventricular diastolic parameters are indeterminate. The average left ventricular global longitudinal strain is -5.8 %. The global longitudinal strain is abnormal.  2. Right ventricular systolic function is normal. The right ventricular size is normal.  3. Left atrial size was mildly dilated.  4. Mild mitral valve regurgitation. FINDINGS  Left Ventricle: Left ventricular ejection fraction, by estimation, is 55 to 60%. The left ventricle has normal function. The left ventricle has no regional wall motion abnormalities. The average left ventricular global longitudinal strain is -5.8 %. The  global longitudinal strain is abnormal. The left ventricular internal cavity size was normal in size. There is no left ventricular hypertrophy. Left ventricular diastolic parameters are indeterminate. Right Ventricle: The right ventricular size is normal. No increase in right ventricular wall thickness. Right ventricular systolic function is normal. Left Atrium: Left atrial size was mildly dilated. Right Atrium: Right atrial size was normal in size. Pericardium: There is no evidence of pericardial effusion. Mitral Valve: The mitral valve is normal in structure. Mild mitral valve regurgitation. No evidence of mitral valve stenosis. MV peak gradient, 4.2 mmHg. The mean mitral valve gradient is 2.0 mmHg. Tricuspid Valve: The tricuspid valve is normal in structure. Tricuspid valve regurgitation is mild . No evidence of tricuspid stenosis. Aortic Valve: The aortic valve is normal in structure. Aortic valve regurgitation is not visualized. No aortic stenosis is present. Aortic valve mean gradient measures 4.0 mmHg. Aortic valve peak gradient measures 6.7 mmHg. Aortic valve area, by VTI measures 3.14 cm. Pulmonic Valve: The pulmonic valve was normal in structure. Pulmonic valve regurgitation is not visualized. No evidence of pulmonic stenosis. Aorta: The  aortic root is normal in size and structure. Venous: The inferior vena cava is normal in size with greater than 50% respiratory variability, suggesting right atrial pressure of 3 mmHg. IAS/Shunts: No atrial level shunt detected by color flow Doppler.  LEFT  VENTRICLE PLAX 2D LVIDd:         4.90 cm     Diastology LVIDs:         4.05 cm     LV e' medial:    4.90 cm/s LV PW:         1.21 cm     LV E/e' medial:  17.0 LV IVS:        0.89 cm     LV e' lateral:   6.85 cm/s LVOT diam:     2.40 cm     LV E/e' lateral: 12.2 LV SV:         56 LV SV Index:   26          2D Longitudinal Strain LVOT Area:     4.52 cm    2D Strain GLS (A2C):   -5.0 %                            2D Strain GLS (A3C):   -3.5 %                            2D Strain GLS (A4C):   -8.9 % LV Volumes (MOD)           2D Strain GLS Avg:     -5.8 % LV vol d, MOD A2C: 65.3 ml LV vol d, MOD A4C: 76.5 ml LV vol s, MOD A2C: 48.2 ml LV vol s, MOD A4C: 51.3 ml LV SV MOD A2C:     17.1 ml LV SV MOD A4C:     76.5 ml LV SV MOD BP:      20.7 ml RIGHT VENTRICLE RV Basal diam:  2.70 cm LEFT ATRIUM             Index       RIGHT ATRIUM           Index LA diam:        4.40 cm 2.02 cm/m  RA Area:     12.00 cm LA Vol (A2C):   41.1 ml 18.91 ml/m RA Volume:   26.30 ml  12.10 ml/m LA Vol (A4C):   71.2 ml 32.76 ml/m LA Biplane Vol: 57.0 ml 26.22 ml/m  AORTIC VALVE                   PULMONIC VALVE AV Area (Vmax):    2.98 cm    PV Vmax:       0.96 m/s AV Area (Vmean):   2.82 cm    PV Vmean:      65.500 cm/s AV Area (VTI):     3.14 cm    PV VTI:        0.117 m AV Vmax:           129.00 cm/s PV Peak grad:  3.7 mmHg AV Vmean:          93.100 cm/s PV Mean grad:  2.0 mmHg AV VTI:            0.177 m AV Peak Grad:      6.7 mmHg AV Mean Grad:      4.0 mmHg LVOT Vmax:         84.90 cm/s LVOT Vmean:        58.000 cm/s LVOT VTI:          0.123 m LVOT/AV VTI ratio: 0.69  AORTA Ao Root diam: 3.40 cm MITRAL VALVE               TRICUSPID VALVE MV Area (PHT): 6.36 cm    TR Peak grad:   25.2  mmHg MV Peak grad:  4.2 mmHg    TR Vmax:        251.00 cm/s MV Mean grad:  2.0 mmHg MV Vmax:       1.02 m/s    SHUNTS MV Vmean:      73.8 cm/s   Systemic VTI:  0.12 m MV Decel Time: 119 msec    Systemic Diam: 2.40 cm MV E velocity: 83.53 cm/s Ida Rogue MD Electronically signed by Ida Rogue MD Signature Date/Time: 10/24/2020/3:54:50 PM    Final       Labs: BNP (last 3 results) No results for input(s): BNP in the last 8760 hours. Basic Metabolic Panel: Recent Labs  Lab 10/23/20 2200 10/24/20 0526 10/25/20 0453 10/26/20 0355 10/27/20 0454  NA 135 134* 131* 126* 130*  K 3.8 3.9 4.5 4.5 4.7  CL 99 100 96* 96* 95*  CO2 26 25 22  21* 26  GLUCOSE 95 113* 135* 131* 122*  BUN 14 13 21  32* 40*  CREATININE 1.04 1.00 1.05 1.04 1.37*  CALCIUM 8.4* 8.2* 9.0 8.5* 8.5*  MG  --   --  2.1 2.0 2.3   Liver Function Tests: Recent Labs  Lab 10/24/20 0526  AST 31  ALT 19  ALKPHOS 42  BILITOT 0.7  PROT 7.1  ALBUMIN 3.4*   No results for input(s): LIPASE, AMYLASE in the last 168 hours. No results for input(s): AMMONIA in the last 168 hours. CBC: Recent Labs  Lab 10/23/20 2200 10/24/20 0526 10/25/20 0453 10/26/20 0355 10/27/20 0454  WBC 8.5 9.4 13.9* 22.2* 18.5*  NEUTROABS  --  7.0  --   --   --   HGB 14.7 13.5 16.0 13.7 13.4  HCT 43.9 40.8 46.3 38.9* 39.5  MCV 91.3 91.3 89.6 88.2 89.2  PLT 199 160 202 215 244   Cardiac Enzymes: No results for input(s): CKTOTAL, CKMB, CKMBINDEX, TROPONINI in the last 168 hours. BNP: Invalid input(s): POCBNP CBG: Recent Labs  Lab 10/27/20 1251  GLUCAP 153*   D-Dimer No results for input(s): DDIMER in the last 72 hours. Hgb A1c No results for input(s): HGBA1C in the last 72 hours. Lipid Profile No results for input(s): CHOL, HDL, LDLCALC, TRIG, CHOLHDL, LDLDIRECT in the last 72 hours. Thyroid function studies No results for input(s): TSH, T4TOTAL, T3FREE, THYROIDAB in the last 72 hours.  Invalid input(s): FREET3 Anemia work up No  results for input(s): VITAMINB12, FOLATE, FERRITIN, TIBC, IRON, RETICCTPCT in the last 72 hours. Urinalysis    Component Value Date/Time   COLORURINE Yellow 04/25/2012 0841   APPEARANCEUR Hazy 04/25/2012 0841   LABSPEC 1.015 04/25/2012 0841   PHURINE 5.0 04/25/2012 0841   GLUCOSEU Negative 04/25/2012 0841   HGBUR Negative 04/25/2012 0841   BILIRUBINUR Negative 04/25/2012 0841   KETONESUR Negative 04/25/2012 0841   PROTEINUR Negative 04/25/2012 0841   NITRITE Negative 04/25/2012 0841   LEUKOCYTESUR 1+ 04/25/2012 0841   Sepsis Labs Invalid input(s): PROCALCITONIN,  WBC,  LACTICIDVEN Microbiology Recent Results (from the past 240 hour(s))  Blood culture (routine x 2)     Status: None (Preliminary result)   Collection Time: 10/23/20 12:37 AM   Specimen: BLOOD  Result Value Ref Range Status   Specimen Description BLOOD LEFT ANTECUBITAL  Final   Special Requests  Final    BOTTLES DRAWN AEROBIC AND ANAEROBIC Blood Culture adequate volume   Culture   Final    NO GROWTH 3 DAYS Performed at [facility], [address] [place], [place] [address]    Report Status PENDING  Incomplete  Resp Panel by RT-PCR (Flu A&B, Covid) Nasopharyngeal Swab     Status: Abnormal   Collection Time: 10/23/20  7:55 PM   Specimen: Nasopharyngeal Swab; Nasopharyngeal(NP) swabs in vial transport medium  Result Value Ref Range Status   SARS Coronavirus 2 by RT PCR POSITIVE (A) NEGATIVE Final    Comment: RESULT CALLED TO, READ BACK BY AND VERIFIED WITH: DR. COOK/2040PM/Oct 23 2020/SFW (NOTE) SARS-CoV-2 target nucleic acids are DETECTED.  The SARS-CoV-2 RNA is generally detectable in upper respiratory specimens during the acute phase of infection. Positive results are indicative of the presence of the identified virus, but do not rule out bacterial infection or co-infection with other pathogens not detected by the test. Clinical correlation with patient history and other diagnostic information is  necessary to determine patient infection status. The expected result is Negative.  Fact Sheet for Patients: EntrepreneurPulse.com.au  Fact Sheet for Healthcare Providers: IncredibleEmployment.be  This test is not yet approved or cleared by the [place] FDA and  has been authorized for detection and/or diagnosis of SARS-CoV-2 by FDA under an Emergency Use Authorization (EUA).  This EUA will remain in effect (meaning this test can be [place] ed) for the duration of  the COVID-19 declaration under Section 564(b)(1) of the Act, 21 U.S.C. section 360bbb-3(b)(1), unless the authorization is terminated or revoked sooner.     Influenza A by PCR NEGATIVE NEGATIVE Final   Influenza B by PCR NEGATIVE NEGATIVE Final    Comment: (NOTE) The Xpert Xpress SARS-CoV-2/FLU/RSV plus assay is intended as an aid in the diagnosis of influenza from Nasopharyngeal swab specimens and should not be used as a sole basis for treatment. Nasal washings and aspirates are unacceptable for Xpert Xpress SARS-CoV-2/FLU/RSV testing.  Fact Sheet for Patients: EntrepreneurPulse.com.au  Fact Sheet for Healthcare Providers: IncredibleEmployment.be  This test is not yet approved or cleared by the [place] FDA and has been authorized for detection and/or diagnosis of SARS-CoV-2 by FDA under an Emergency Use Authorization (EUA). This EUA will remain in effect (meaning this test can be used) for the duration of the COVID-19 declaration under Section 564(b)(1) of the Act, 21 U.S.C. section 360bbb-3(b)(1), unless the authorization is terminated or revoked.  Performed at [facility], [address], [place], [place] [address]   Blood culture (routine x 2)     Status: None (Preliminary result)   Collection Time: 10/23/20 10:01 PM   Specimen: BLOOD  Result Value Ref Range Status   Specimen Description BLOOD RIGHT ASSIST CONTROL   Final   Special Requests   Final    BOTTLES DRAWN AEROBIC AND ANAEROBIC Blood Culture results may not be optimal due to an excessive volume of blood received in culture bottles   Culture   Final    NO GROWTH 4 DAYS Performed at [facility], [address] [place], [place] [address]    Report Status PENDING  Incomplete     Total time spend on discharging this patient, including the last patient exam, discussing the hospital stay, instructions for ongoing care as it relates to all pertinent caregivers, as well as preparing the medical discharge records, prescriptions, and/or referrals as applicable, is 35 minutes.    Nathaniel Bi, MD  [facility] 10/27/2020, 1:49 PM

## 2020-10-27 NOTE — Plan of Care (Signed)

## 2020-10-28 LAB — CULTURE, BLOOD (ROUTINE X 2): Culture: NO GROWTH

## 2020-10-29 LAB — CULTURE, BLOOD (ROUTINE X 2)
Culture: NO GROWTH
Special Requests: ADEQUATE

## 2020-11-01 DIAGNOSIS — I4891 Unspecified atrial fibrillation: Secondary | ICD-10-CM | POA: Diagnosis not present

## 2020-11-01 DIAGNOSIS — I10 Essential (primary) hypertension: Secondary | ICD-10-CM | POA: Diagnosis not present

## 2020-11-01 DIAGNOSIS — Z8616 Personal history of COVID-19: Secondary | ICD-10-CM | POA: Diagnosis not present

## 2020-11-04 DIAGNOSIS — Z8616 Personal history of COVID-19: Secondary | ICD-10-CM | POA: Diagnosis not present

## 2020-11-04 DIAGNOSIS — E782 Mixed hyperlipidemia: Secondary | ICD-10-CM | POA: Diagnosis not present

## 2020-11-04 DIAGNOSIS — Z23 Encounter for immunization: Secondary | ICD-10-CM | POA: Diagnosis not present

## 2020-11-04 DIAGNOSIS — I4891 Unspecified atrial fibrillation: Secondary | ICD-10-CM | POA: Diagnosis not present

## 2020-11-04 DIAGNOSIS — K219 Gastro-esophageal reflux disease without esophagitis: Secondary | ICD-10-CM | POA: Diagnosis not present

## 2020-11-04 DIAGNOSIS — I10 Essential (primary) hypertension: Secondary | ICD-10-CM | POA: Diagnosis not present

## 2020-11-11 DIAGNOSIS — I4819 Other persistent atrial fibrillation: Secondary | ICD-10-CM | POA: Diagnosis not present

## 2020-11-11 DIAGNOSIS — I4891 Unspecified atrial fibrillation: Secondary | ICD-10-CM | POA: Diagnosis not present

## 2020-11-20 DIAGNOSIS — I517 Cardiomegaly: Secondary | ICD-10-CM | POA: Diagnosis not present

## 2020-11-20 DIAGNOSIS — J9811 Atelectasis: Secondary | ICD-10-CM | POA: Diagnosis not present

## 2020-11-20 DIAGNOSIS — J189 Pneumonia, unspecified organism: Secondary | ICD-10-CM | POA: Diagnosis not present

## 2020-11-22 DIAGNOSIS — R059 Cough, unspecified: Secondary | ICD-10-CM | POA: Diagnosis not present

## 2020-11-22 DIAGNOSIS — U071 COVID-19: Secondary | ICD-10-CM | POA: Diagnosis not present

## 2020-11-26 DIAGNOSIS — I4819 Other persistent atrial fibrillation: Secondary | ICD-10-CM | POA: Diagnosis not present

## 2020-11-27 DIAGNOSIS — I4819 Other persistent atrial fibrillation: Secondary | ICD-10-CM | POA: Diagnosis not present

## 2020-11-27 DIAGNOSIS — E782 Mixed hyperlipidemia: Secondary | ICD-10-CM | POA: Diagnosis not present

## 2020-11-27 DIAGNOSIS — Z01818 Encounter for other preprocedural examination: Secondary | ICD-10-CM | POA: Diagnosis not present

## 2020-11-27 DIAGNOSIS — I10 Essential (primary) hypertension: Secondary | ICD-10-CM | POA: Diagnosis not present

## 2020-11-27 DIAGNOSIS — Z8616 Personal history of COVID-19: Secondary | ICD-10-CM | POA: Diagnosis not present

## 2020-11-27 NOTE — ED Provider Notes (Signed)
Critical care time from 10/23/2020 visit 15 minutes.  This includes evaluating the patient and treating the patient reviewing his old records and speaking to the hospitalist.   Nena Polio, MD 11/27/20 2046

## 2020-11-29 NOTE — Progress Notes (Signed)
Arrived at the covid testing site, was positive for covid on 10/23/2020. Results in Epic. No need to re-test per policy.

## 2020-12-03 ENCOUNTER — Encounter
Admission: RE | Disposition: A | Discharge status: Home / Self Care | Payer: Self-pay | Source: Home / Self Care | Attending: Cardiology

## 2020-12-03 ENCOUNTER — Ambulatory Visit: Payer: PPO | Admitting: Anesthesiology

## 2020-12-03 ENCOUNTER — Other Ambulatory Visit: Payer: Self-pay

## 2020-12-03 ENCOUNTER — Encounter: Payer: Self-pay | Admitting: Cardiology

## 2020-12-03 ENCOUNTER — Ambulatory Visit
Admission: RE | Admit: 2020-12-03 | Discharge: 2020-12-03 | Disposition: A | Discharge status: Home / Self Care | Payer: PPO | Attending: Cardiology | Admitting: Cardiology

## 2020-12-03 DIAGNOSIS — Z800 Family history of malignant neoplasm of digestive organs: Secondary | ICD-10-CM | POA: Diagnosis not present

## 2020-12-03 DIAGNOSIS — I480 Paroxysmal atrial fibrillation: Secondary | ICD-10-CM | POA: Diagnosis not present

## 2020-12-03 DIAGNOSIS — I10 Essential (primary) hypertension: Secondary | ICD-10-CM | POA: Diagnosis not present

## 2020-12-03 DIAGNOSIS — Z7901 Long term (current) use of anticoagulants: Secondary | ICD-10-CM | POA: Insufficient documentation

## 2020-12-03 DIAGNOSIS — E782 Mixed hyperlipidemia: Secondary | ICD-10-CM | POA: Diagnosis not present

## 2020-12-03 DIAGNOSIS — Z79899 Other long term (current) drug therapy: Secondary | ICD-10-CM | POA: Diagnosis not present

## 2020-12-03 DIAGNOSIS — I4891 Unspecified atrial fibrillation: Secondary | ICD-10-CM | POA: Diagnosis not present

## 2020-12-03 DIAGNOSIS — K219 Gastro-esophageal reflux disease without esophagitis: Secondary | ICD-10-CM | POA: Diagnosis not present

## 2020-12-03 DIAGNOSIS — Z8616 Personal history of COVID-19: Secondary | ICD-10-CM | POA: Diagnosis not present

## 2020-12-03 DIAGNOSIS — M109 Gout, unspecified: Secondary | ICD-10-CM | POA: Diagnosis not present

## 2020-12-03 HISTORY — PX: CARDIOVERSION: SHX1299

## 2020-12-03 HISTORY — DX: Unspecified atrial fibrillation: I48.91

## 2020-12-03 SURGERY — CARDIOVERSION
Anesthesia: General

## 2020-12-03 MED ORDER — PROPOFOL 10 MG/ML IV BOLUS
INTRAVENOUS | Status: AC
  Filled 2020-12-03: qty 20

## 2020-12-03 MED ORDER — PHENYLEPHRINE HCL (PRESSORS) 10 MG/ML IV SOLN
INTRAVENOUS | Status: AC
  Filled 2020-12-03: qty 1

## 2020-12-03 MED ORDER — PROPOFOL 10 MG/ML IV BOLUS
INTRAVENOUS | Status: DC | PRN
  Administered 2020-12-03: 50 mg via INTRAVENOUS
  Administered 2020-12-03: 10 mg via INTRAVENOUS

## 2020-12-03 MED ORDER — SODIUM CHLORIDE 0.9 % IV SOLN: INTRAVENOUS | Status: DC

## 2020-12-03 NOTE — Procedures (Signed)
Procedure:  Cardioversion  Indication-afib  Procedure. After informed consent, time out protocol and adequate sedation per dept of anesthesia with propofol, pt received a 120J synchronized dc cardioverson to nsr. No immediate complications.

## 2020-12-03 NOTE — Anesthesia Postprocedure Evaluation (Signed)
Anesthesia Post Note  Patient: [name]  Procedure(s) Performed: CARDIOVERSION (N/A )  Patient location during evaluation: Specials Recovery Anesthesia Type: General Level of consciousness: awake and alert Pain management: pain level controlled Vital Signs Assessment: post-procedure vital signs reviewed and stable Respiratory status: spontaneous breathing, nonlabored ventilation, respiratory function stable and patient connected to nasal cannula oxygen Cardiovascular status: blood pressure returned to baseline and stable Postop Assessment: no apparent nausea or vomiting Anesthetic complications: no   No complications documented.   Last Vitals:  Vitals:   12/03/20 0848 12/03/20 0849  BP:  (!) 140/98  Pulse: 89 88  Resp: (!) 31 (!) 21  Temp:    SpO2: 93% 94%    Last Pain:  Vitals:   12/03/20 0849  TempSrc:   PainSc: 0-No pain                 Arita Miss

## 2020-12-03 NOTE — Discharge Instructions (Signed)
Electrical Cardioversion Electrical cardioversion is the delivery of a jolt of electricity to restore a normal rhythm to the heart. A rhythm that is too fast or is not regular keeps the heart from pumping well. In this procedure, sticky patches or metal paddles are placed on the chest to deliver electricity to the heart from a device. This procedure may be done in an emergency if:  There is low or no blood pressure as a result of the heart rhythm.  Normal rhythm must be restored as fast as possible to protect the brain and heart from further damage.  It may save a life. This may also be a scheduled procedure for irregular or fast heart rhythms that are not immediately life-threatening. Tell a health care provider about:  Any allergies you have.  All medicines you are taking, including vitamins, herbs, eye drops, creams, and over-the-counter medicines.  Any problems you or family members have had with anesthetic medicines.  Any blood disorders you have.  Any surgeries you have had.  Any medical conditions you have.  Whether you are pregnant or may be pregnant. What are the risks? Generally, this is a safe procedure. However, problems may occur, including:  Allergic reactions to medicines.  A blood clot that breaks free and travels to other parts of your body.  The possible return of an abnormal heart rhythm within hours or days after the procedure.  Your heart stopping (cardiac arrest). This is rare. What happens before the procedure? Medicines  Your health care provider may have you start taking: ? Blood-thinning medicines (anticoagulants) so your blood does not clot as easily. ? Medicines to help stabilize your heart rate and rhythm.  Ask your health care provider about: ? Changing or stopping your regular medicines. This is especially important if you are taking diabetes medicines or blood thinners. ? Taking medicines such as aspirin and ibuprofen. These medicines can  thin your blood. Do not take these medicines unless your health care provider tells you to take them. ? Taking over-the-counter medicines, vitamins, herbs, and supplements. General instructions  Follow instructions from your health care provider about eating or drinking restrictions.  Plan to have someone take you home from the hospital or clinic.  If you will be going home right after the procedure, plan to have someone with you for 24 hours.  Ask your health care provider what steps will be taken to help prevent infection. These may include washing your skin with a germ-killing soap. What happens during the procedure?  An IV will be inserted into one of your veins.  Sticky patches (electrodes) or metal paddles may be placed on your chest.  You will be given a medicine to help you relax (sedative).  An electrical shock will be delivered. The procedure may vary among health care providers and hospitals.   What can I expect after the procedure?  Your blood pressure, heart rate, breathing rate, and blood oxygen level will be monitored until you leave the hospital or clinic.  Your heart rhythm will be watched to make sure it does not change.  You may have some redness on the skin where the shocks were given. Follow these instructions at home:  Do not drive for 24 hours if you were given a sedative during your procedure.  Take over-the-counter and prescription medicines only as told by your health care provider.  Ask your health care provider how to check your pulse. Check it often.  Rest for 48 hours after the procedure   or as told by your health care provider.  Avoid or limit your caffeine use as told by your health care provider.  Keep all follow-up visits as told by your health care provider. This is important. Contact a health care provider if:  You feel like your heart is beating too quickly or your pulse is not regular.  You have a serious muscle cramp that does not go  away. Get help right away if:  You have discomfort in your chest.  You are dizzy or you feel faint.  You have trouble breathing or you are short of breath.  Your speech is slurred.  You have trouble moving an arm or leg on one side of your body.  Your fingers or toes turn cold or blue. Summary  Electrical cardioversion is the delivery of a jolt of electricity to restore a normal rhythm to the heart.  This procedure may be done right away in an emergency or may be a scheduled procedure if the condition is not an emergency.  Generally, this is a safe procedure.  After the procedure, check your pulse often as told by your health care provider. This information is not intended to replace advice given to you by your health care provider. Make sure you discuss any questions you have with your health care provider. Document Revised: 06/05/2019 Document Reviewed: 06/05/2019 Elsevier Patient Education  2021 [employer]  

## 2020-12-03 NOTE — Anesthesia Preprocedure Evaluation (Addendum)
Anesthesia Evaluation  Patient identified by MRN, date of birth, ID band Patient awake    Reviewed: Allergy & Precautions, NPO status , Patient's Chart, lab work & pertinent test results  History of Anesthesia Complications Negative for: history of anesthetic complications  Airway Mallampati: III  TM Distance: >3 FB Neck ROM: Full    Dental  (+) Partial Upper   Pulmonary neg sleep apnea, pneumonia, resolved, neg COPD, Patient abstained from smoking.Not current smoker,  covid pneumonia 10/23/2020   Pulmonary exam normal breath sounds clear to auscultation       Cardiovascular Exercise Tolerance: Good METShypertension, (-) CAD and (-) Past MI + dysrhythmias Atrial Fibrillation  Rhythm:Regular Rate:Normal - Systolic murmurs TTE 16/5537: 1. Left ventricular ejection fraction, by estimation, is 55 to 60%. The  left ventricle has normal function. The left ventricle has no regional  wall motion abnormalities. Left ventricular diastolic parameters are  indeterminate. The average left  ventricular global longitudinal strain is -5.8 %. The global longitudinal  strain is abnormal.  2. Right ventricular systolic function is normal. The right ventricular  size is normal.  3. Left atrial size was mildly dilated.  4. Mild mitral valve regurgitation.    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 07/09/2014: Squamous cell carcinoma of skin     Comment:   Anterior neck, In situ  Reproductive/Obstetrics                            Anesthesia Physical Anesthesia Plan  ASA: III  Anesthesia Plan: General   Post-op Pain Management:    Induction: Intravenous  PONV Risk Score and Plan: 2 and Ondansetron, Propofol infusion and TIVA  Airway Management Planned: Nasal Cannula  Additional Equipment: None  Intra-op Plan:   Post-operative Plan:   Informed Consent: I have reviewed the patients History and Physical, chart, labs and discussed the procedure including the risks, benefits and alternatives for the proposed anesthesia with the patient or authorized representative who has indicated his/her understanding and acceptance.     Dental advisory given  Plan Discussed with: CRNA and Surgeon  Anesthesia Plan Comments: (Discussed risks of anesthesia with patient, including possibility of difficulty with spontaneous ventilation under anesthesia necessitating airway intervention, PONV, and rare risks such as cardiac or respiratory or neurological events. Patient understands.)        Anesthesia Quick Evaluation

## 2020-12-03 NOTE — H&P (Signed)
Chief Complaint: No chief complaint on file. Date of Service: 11/27/2020 Date of Birth: [birth year] PCP: Dennison Nancy, MD  History of Present Illness: Mr. [name] is a 75 y.o.male patient who returns for  1. Paroxysmal atrial fibrillation 2. Essential hypertension 3. Hyperlipidemia 4. COVID-19 pneumonia 10/23/2020  Patient recently hospitalized 10/23/2020 with COVID-19 pneumonia at which time he experienced atrial fibrillation with rapid ventricular rate. Patient has a history of paroxysmal atrial fibrillation first noted during hospitalization for pneumonia in 2017. 2D echocardiogram on 10/23/2020 revealed normal left ventricular function, with LVEF of 55 to 60%. Patient was discharged home on Eliquis for stroke prevention, and metoprolol tartrate for rate control. He returns today, reports doing well. He denies chest pain or shortness of breath. He denies palpitations or heart racing. He denies peripheral edema. The patient has resumed his normal activity. He works part-time as a [employer]arry manager. EKG 11/04/2020 revealed atrial fibrillation with nonspecific ST abnormalities at a rate of 78 bpm. 7-day Holter monitor revealed predominant atrial fibrillation with mean heart rate of 99 bpm, heart rate range 64 to 189 bpm, with occasional premature ventricular contractions.  The patient has a history of paroxysmal atrial fibrillation, on Eliquis for stroke prevention. Patient denies any bleeding side effects.  Patient has essential hypertension, blood pressure well controlled on metoprolol tartrate, lisinopril, and hydrochlorothiazide, which are well-tolerated without apparent side effects. The patient follows a low-sodium, no added salt diet.  The patient has hyperlipidemia, on lovastatin, which is well-tolerated without apparent side effects, followed by his primary care provider. The patient follows a low-cholesterol, low-fat diet.  Past Medical and Surgical History  Past Medical  History Past Medical History:  Diagnosis Date  . Arthritis  . Cataracts, bilateral  . Chickenpox  . GERD (gastroesophageal reflux disease)  . Gout, joint  . Hypertension  . Kidney stones  uric acid 6.3 in 08/18/2013  . Shingles   Past Surgical History He has a past surgical history that includes Kidney stones (1972, 1988, 1993); Joint replacement (Right, 05/09/2012); Cataract extraction (Bilateral, 2016); egd (10/21/2006); Colonoscopy (10/21/2006); and Colonoscopy (01/20/2017).   Medications and Allergies  Current Medications  Current Outpatient Medications  Medication Sig Dispense Refill  . acetaminophen (TYLENOL) 500 MG tablet Take 500 mg by mouth every 8 (eight) hours as needed for Pain  . amoxicillin (AMOXIL) 500 MG tablet 2000 mg 1 hr prior to dental procedure  . apixaban (ELIQUIS) 5 mg tablet Take 5 mg by mouth 2 (two) times daily  . fluticasone propionate (FLONASE) 50 mcg/actuation nasal spray Place 2 sprays into both nostrils once daily 16 g 0  . guaiFENesin (MUCINEX) 600 mg SR tablet Take 600 mg by mouth every 12 (twelve) hours as needed for Cough  . lovastatin (MEVACOR) 40 MG tablet TAKE 1 TABLET (40 MG TOTAL) BY MOUTH DAILY WITH DINNER. 90 tablet 1  . metoprolol tartrate (LOPRESSOR) 50 MG tablet Take 50 mg by mouth 2 (two) times daily  . multivitamin tablet Take 1 tablet by mouth once daily.  . naproxen sodium (ALEVE, ANAPROX) 220 MG tablet Take 220 mg by mouth 2 (two) times daily with meals  . omeprazole (PRILOSEC) 20 MG DR capsule TAKE 1 CAPSULE (20 MG TOTAL) BY MOUTH ONCE DAILY AS NEEDED. 90 capsule 1  . predniSONE (DELTASONE) 10 MG tablet Take 1 tablet (10 mg total) by mouth once daily 10 tablet 0  . aspirin 81 MG EC tablet Take 81 mg by mouth every other day Taking every other day (Patient not  taking: Reported on 11/04/2020 )  . glucosamine HCl 500 mg Tab Take 500 mg by mouth once daily (Patient not taking: Reported on 11/22/2020 )  . hydrocodone-chlorpheniramine  (TUSSIONEX) 10-8 mg/5 mL ER suspension Take 5 mLs by mouth nightly as needed for Cough (Patient not taking: Reported on 11/27/2020 ) 100 mL 0   No current facility-administered medications for this visit.   Allergies: Sulfa (sulfonamide antibiotics), Lipitor [atorvastatin], and Zocor [simvastatin]  Social and Family History  Social History reports that he has never smoked. He has never used smokeless tobacco. He reports that he does not drink alcohol and does not use drugs.  Family History Family History  Problem Relation Age of Onset  . Liver cancer Mother  deceased at age 53  . Pancreatic cancer Father  . Colon polyps Neg Hx  . Colon cancer Neg Hx   Review of Systems   Review of Systems: The patient denies chest pain, shortness of breath, orthopnea, paroxysmal nocturnal dyspnea, pedal edema, palpitations, heart racing, presyncope, syncope. Review of 12 Systems is negative except as described above.  Physical Examination   Vitals:BP 130/78  Pulse 85  Ht 177.8 cm (5\' 10" )  Wt 96.2 kg (212 lb)  SpO2 96%  BMI 30.42 kg/m  Ht:177.8 cm (5\' 10" ) Wt:96.2 kg (212 lb) KGY:JEHU surface area is 2.18 meters squared. Body mass index is 30.42 kg/m.  HEENT: Pupils equally reactive to light and accomodation  Neck: Supple without thyromegaly, carotid pulses 2+ Lungs: clear to auscultation bilaterally; no wheezes, rales, rhonchi Heart: Regular rate and rhythm. No gallops, murmurs or rub Abdomen: soft nontender, nondistended, with normal bowel sounds Extremities: no cyanosis, clubbing, or edema Peripheral Pulses: 2+ in all extremities, 2+ femoral pulses bilaterally  Assessment   75 y.o. male with  1. Primary hypertension  2. Persistent atrial fibrillation (CMS-HCC)  3. Hyperlipidemia, mixed  4. History of 2019 novel coronavirus disease (COVID-73)   75 year old gentleman with paroxysmal atrial fibrillation, remains in atrial fibrillation with controlled ventricular rate, asymptomatic,  in the setting of COVID-19 pneumonia, on Eliquis for stroke prevention. 7-day Holter monitor reveals persistent atrial fibrillation with mean heart rate at 99 bpm. Patient has essential hypertension, blood pressure well controlled on current BP medications.  Plan   1. Continue current medications 2. Counseled patient about low-sodium diet 3. DASH diet printed instructions given to the patient 4. Counseled patient about low-cholesterol diet 5. Continue lovastatin for hyperlipidemia Edg[place] 6. Low-fat and cholesterol diet printed instructions given to the patient 7. Continue Eliquis for stroke prevention 8. Proceed with electrical cardioversion. The risk, benefits and alternatives of electrical cardioversion were explained to the patient and informed written consent was obtained.  No orders of the defined types were placed in this encounter.  Return in about 1 week (around 12/04/2020), or after cardioversion.  Isaias Cowman, MD PhD [facility]  Pt seen and examined. No change from above.

## 2020-12-03 NOTE — Transfer of Care (Signed)
Immediate Anesthesia Transfer of Care Note  Patient: [name]  Procedure(s) Performed: CARDIOVERSION (N/A )  Patient Location: PACU and Special Procedures  Anesthesia Type:General  Level of Consciousness: drowsy  Airway & Oxygen Therapy: Patient Spontanous Breathing and Patient connected to nasal cannula oxygen  Post-op Assessment: Report given to RN and Post -op Vital signs reviewed and stable  Post vital signs: Reviewed and stable  Last Vitals:  Vitals Value Taken Time  BP 140/98 12/03/20 0845  Temp    Pulse 88 12/03/20 0849  Resp 25 12/03/20 0849  SpO2 94 % 12/03/20 0849  Vitals shown include unvalidated device data.  Last Pain:  Vitals:   12/03/20 0734  TempSrc: Oral  PainSc: 0-No pain         Complications: No complications documented.

## 2020-12-03 NOTE — Progress Notes (Signed)
Patient arrived to [facility] today for Cardioversion, although [facility] staff were told procedure was cancelled and office had called patient. Patient stated he did not receive a call for cancellation, took a day off work, and would like to get this done today with another doctor. Notified Dr Ubaldo Glassing and anesthesia team of situation and all are available at 0900, patient in agreement to schedule at that time with Dr Ubaldo Glassing.

## 2020-12-11 DIAGNOSIS — E782 Mixed hyperlipidemia: Secondary | ICD-10-CM | POA: Diagnosis not present

## 2020-12-11 DIAGNOSIS — I10 Essential (primary) hypertension: Secondary | ICD-10-CM | POA: Diagnosis not present

## 2020-12-11 DIAGNOSIS — I4819 Other persistent atrial fibrillation: Secondary | ICD-10-CM | POA: Diagnosis not present

## 2021-01-27 DIAGNOSIS — Z961 Presence of intraocular lens: Secondary | ICD-10-CM | POA: Diagnosis not present

## 2021-01-31 DIAGNOSIS — M1990 Unspecified osteoarthritis, unspecified site: Secondary | ICD-10-CM | POA: Diagnosis not present

## 2021-01-31 DIAGNOSIS — Z1331 Encounter for screening for depression: Secondary | ICD-10-CM | POA: Diagnosis not present

## 2021-01-31 DIAGNOSIS — I10 Essential (primary) hypertension: Secondary | ICD-10-CM | POA: Diagnosis not present

## 2021-01-31 DIAGNOSIS — M109 Gout, unspecified: Secondary | ICD-10-CM | POA: Diagnosis not present

## 2021-01-31 DIAGNOSIS — Z0000 Encounter for general adult medical examination without abnormal findings: Secondary | ICD-10-CM | POA: Diagnosis not present

## 2021-01-31 DIAGNOSIS — I4819 Other persistent atrial fibrillation: Secondary | ICD-10-CM | POA: Diagnosis not present

## 2021-01-31 DIAGNOSIS — K219 Gastro-esophageal reflux disease without esophagitis: Secondary | ICD-10-CM | POA: Diagnosis not present

## 2021-01-31 DIAGNOSIS — Z125 Encounter for screening for malignant neoplasm of prostate: Secondary | ICD-10-CM | POA: Diagnosis not present

## 2021-01-31 DIAGNOSIS — E782 Mixed hyperlipidemia: Secondary | ICD-10-CM | POA: Diagnosis not present

## 2021-02-05 DIAGNOSIS — I10 Essential (primary) hypertension: Secondary | ICD-10-CM | POA: Diagnosis not present

## 2021-02-05 DIAGNOSIS — E782 Mixed hyperlipidemia: Secondary | ICD-10-CM | POA: Diagnosis not present

## 2021-02-05 DIAGNOSIS — R0902 Hypoxemia: Secondary | ICD-10-CM | POA: Diagnosis not present

## 2021-02-05 DIAGNOSIS — I4819 Other persistent atrial fibrillation: Secondary | ICD-10-CM | POA: Diagnosis not present

## 2021-02-07 DIAGNOSIS — Z125 Encounter for screening for malignant neoplasm of prostate: Secondary | ICD-10-CM | POA: Diagnosis not present

## 2021-02-07 DIAGNOSIS — I4819 Other persistent atrial fibrillation: Secondary | ICD-10-CM | POA: Diagnosis not present

## 2021-02-07 DIAGNOSIS — M109 Gout, unspecified: Secondary | ICD-10-CM | POA: Diagnosis not present

## 2021-02-07 DIAGNOSIS — K219 Gastro-esophageal reflux disease without esophagitis: Secondary | ICD-10-CM | POA: Diagnosis not present

## 2021-02-07 DIAGNOSIS — I10 Essential (primary) hypertension: Secondary | ICD-10-CM | POA: Diagnosis not present

## 2021-02-07 DIAGNOSIS — E782 Mixed hyperlipidemia: Secondary | ICD-10-CM | POA: Diagnosis not present

## 2021-03-12 DIAGNOSIS — R059 Cough, unspecified: Secondary | ICD-10-CM | POA: Diagnosis not present

## 2021-05-01 ENCOUNTER — Ambulatory Visit: Payer: PPO | Admitting: Dermatology

## 2021-05-01 ENCOUNTER — Other Ambulatory Visit: Payer: Self-pay

## 2021-05-01 DIAGNOSIS — L820 Inflamed seborrheic keratosis: Secondary | ICD-10-CM

## 2021-05-01 DIAGNOSIS — L814 Other melanin hyperpigmentation: Secondary | ICD-10-CM

## 2021-05-01 DIAGNOSIS — Z86007 Personal history of in-situ neoplasm of skin: Secondary | ICD-10-CM

## 2021-05-01 DIAGNOSIS — L821 Other seborrheic keratosis: Secondary | ICD-10-CM

## 2021-05-01 NOTE — Patient Instructions (Signed)

## 2021-05-01 NOTE — Progress Notes (Signed)
   Follow-Up Visit   Subjective  [name] is a 75 y.o. male who presents for the following: new skin lesion (On the L cheek - patient hits with a razor when he shaves, has noticed it growing larger has been present since March of this year). H/o SCCIS neck   The following portions of the chart were reviewed this encounter and updated as appropriate:       Review of Systems:  No other skin or systemic complaints except as noted in HPI or Assessment and Plan.  Objective  Well appearing patient in no apparent distress; mood and affect are within normal limits.  A focused examination was performed including the face. Relevant physical exam findings are noted in the Assessment and Plan.  L cheek x 1 Erythematous keratotic or waxy stuck-on papule  Assessment & Plan  Inflamed seborrheic keratosis L cheek x 1  Recheck at follow up appointment. May need additional treatment, or biopsy if not improved  Destruction of lesion - L cheek x 1  Destruction method: cryotherapy   Informed consent: discussed and consent obtained   Lesion destroyed using liquid nitrogen: Yes   Region frozen until ice ball extended beyond lesion: Yes   Outcome: patient tolerated procedure well with no complications   Post-procedure details: wound care instructions given   Additional details:  Prior to procedure, discussed risks of blister formation, small wound, skin dyspigmentation, or rare scar following cryotherapy. Recommend Vaseline ointment to treated areas while healing.   Seborrheic Keratoses - Stuck-on, waxy, tan-brown papules and/or plaques  - Benign-appearing - Discussed benign etiology and prognosis. - Observe - Call for any changes   Lentigines - Scattered tan macules - Due to sun exposure - Benign-appering, observe - Recommend daily broad spectrum sunscreen SPF 30+ to sun-exposed areas, reapply every 2 hours as needed. - Call for any changes   History of Squamous Cell Carcinoma in  Situ of the Skin - No evidence of recurrence today anterior neck - Recommend regular full body skin exams - Recommend daily broad spectrum sunscreen SPF 30+ to sun-exposed areas, reapply every 2 hours as needed.  - Call if any new or changing lesions are noted between office visits   Return in 2 months (on 07/01/2021) for ISK.  I, Rudell Cobb, CMA, am acting as scribe for Brendolyn Patty, MD .  Documentation: I have reviewed the above documentation for accuracy and completeness, and I agree with the above.  Brendolyn Patty MD

## 2021-07-14 ENCOUNTER — Ambulatory Visit: Payer: PPO | Admitting: Dermatology

## 2021-08-13 DIAGNOSIS — K219 Gastro-esophageal reflux disease without esophagitis: Secondary | ICD-10-CM | POA: Diagnosis not present

## 2021-08-13 DIAGNOSIS — Z23 Encounter for immunization: Secondary | ICD-10-CM | POA: Diagnosis not present

## 2021-08-13 DIAGNOSIS — M1990 Unspecified osteoarthritis, unspecified site: Secondary | ICD-10-CM | POA: Diagnosis not present

## 2021-08-13 DIAGNOSIS — I10 Essential (primary) hypertension: Secondary | ICD-10-CM | POA: Diagnosis not present

## 2021-08-13 DIAGNOSIS — I4819 Other persistent atrial fibrillation: Secondary | ICD-10-CM | POA: Diagnosis not present

## 2021-08-13 DIAGNOSIS — E782 Mixed hyperlipidemia: Secondary | ICD-10-CM | POA: Diagnosis not present

## 2021-09-15 DIAGNOSIS — J069 Acute upper respiratory infection, unspecified: Secondary | ICD-10-CM | POA: Diagnosis not present

## 2021-09-15 DIAGNOSIS — I4819 Other persistent atrial fibrillation: Secondary | ICD-10-CM | POA: Diagnosis not present

## 2021-09-24 ENCOUNTER — Other Ambulatory Visit: Payer: Self-pay

## 2021-09-24 ENCOUNTER — Ambulatory Visit: Payer: PPO | Admitting: Dermatology

## 2021-09-24 ENCOUNTER — Encounter: Payer: Self-pay | Admitting: Dermatology

## 2021-09-24 DIAGNOSIS — L820 Inflamed seborrheic keratosis: Secondary | ICD-10-CM | POA: Diagnosis not present

## 2021-09-24 DIAGNOSIS — L918 Other hypertrophic disorders of the skin: Secondary | ICD-10-CM

## 2021-09-24 DIAGNOSIS — L821 Other seborrheic keratosis: Secondary | ICD-10-CM

## 2021-09-24 DIAGNOSIS — L578 Other skin changes due to chronic exposure to nonionizing radiation: Secondary | ICD-10-CM | POA: Diagnosis not present

## 2021-09-24 DIAGNOSIS — L814 Other melanin hyperpigmentation: Secondary | ICD-10-CM | POA: Diagnosis not present

## 2021-09-24 DIAGNOSIS — Z86007 Personal history of in-situ neoplasm of skin: Secondary | ICD-10-CM

## 2021-09-24 DIAGNOSIS — D229 Melanocytic nevi, unspecified: Secondary | ICD-10-CM | POA: Diagnosis not present

## 2021-09-24 DIAGNOSIS — D1800 Hemangioma unspecified site: Secondary | ICD-10-CM | POA: Diagnosis not present

## 2021-09-24 DIAGNOSIS — D0439 Carcinoma in situ of skin of other parts of face: Secondary | ICD-10-CM | POA: Diagnosis not present

## 2021-09-24 DIAGNOSIS — Z1283 Encounter for screening for malignant neoplasm of skin: Secondary | ICD-10-CM | POA: Diagnosis not present

## 2021-09-24 DIAGNOSIS — D0430 Carcinoma in situ of skin of unspecified part of face: Secondary | ICD-10-CM

## 2021-09-24 DIAGNOSIS — D492 Neoplasm of unspecified behavior of bone, soft tissue, and skin: Secondary | ICD-10-CM

## 2021-09-24 NOTE — Progress Notes (Signed)
Follow-Up Visit   Subjective  [name] is a 75 y.o. male who presents for the following: Annual Exam (Mole check ). Yearly mole check hx of SCCis, pt c/o irritated growth on the left temple and arms that are growing and changing.  The patient presents for Total-Body Skin Exam (TBSE) for skin cancer screening and mole check.   The following portions of the chart were reviewed this encounter and updated as appropriate:   Tobacco  Allergies  Meds  Problems  Med Hx  Surg Hx  Fam Hx     Review of Systems:  No other skin or systemic complaints except as noted in HPI or Assessment and Plan.  Objective  Well appearing patient in no apparent distress; mood and affect are within normal limits.  A full examination was performed including scalp, head, eyes, ears, nose, lips, neck, chest, axillae, abdomen, back, buttocks, bilateral upper extremities, bilateral lower extremities, hands, feet, fingers, toes, fingernails, and toenails. All findings within normal limits unless otherwise noted below.  left temple 0.9 cm hyperkeratotic papule        right arm x 3 (3) Fleshy, skin-colored pedunculated papules.    left side x 1 (4) Erythematous keratotic or waxy stuck-on papule or plaque.    Assessment & Plan  Neoplasm of skin left temple  Epidermal / dermal shaving  Lesion diameter (cm):  0.9 Informed consent: discussed and consent obtained   Timeout: patient name, date of birth, surgical site, and procedure verified   Patient was prepped and draped in usual sterile fashion: area prepped with alcohol. Anesthesia: the lesion was anesthetized in a standard fashion   Anesthetic:  1% lidocaine w/ epinephrine 1-100,000 local infiltration Instrument used: flexible razor blade   Hemostasis achieved with: pressure, aluminum chloride and electrodesiccation   Outcome: patient tolerated procedure well   Post-procedure details: wound care instructions given   Post-procedure details  comment:  Ointment and a small bandage applied  Destruction of lesion  Destruction method: electrodesiccation and curettage   Informed consent: discussed and consent obtained   Timeout:  patient name, date of birth, surgical site, and procedure verified Anesthesia: the lesion was anesthetized in a standard fashion   Anesthetic:  1% lidocaine w/ epinephrine 1-100,000 buffered w/ 8.4% NaHCO3 Curettage performed in three different directions: Yes   Electrodesiccation performed over the curetted area: Yes   Curettage cycles:  3 Lesion length (cm):  0.7 Lesion width (cm):  0.7 Margin per side (cm):  0.2 Final wound size (cm):  1.1 Hemostasis achieved with:  electrodesiccation Outcome: patient tolerated procedure well with no complications   Post-procedure details: sterile dressing applied and wound care instructions given   Dressing type: petrolatum    Specimen 1 - Surgical pathology Differential Diagnosis: R/O skin cancer   Check Margins: No  Acrochordon (3) right axilla x 3 Irritated skin tags  Prior to procedure, discussed risks of blister formation, small wound, skin dyspigmentation, or rare scar following cryotherapy. Recommend Vaseline ointment to treated areas while healing.   Destruction of lesion - right axilla x 3 Complexity: simple   Destruction method: cryotherapy   Informed consent: discussed and consent obtained   Timeout:  patient name, date of birth, surgical site, and procedure verified Lesion destroyed using liquid nitrogen: Yes   Region frozen until ice ball extended beyond lesion: Yes   Outcome: patient tolerated procedure well with no complications   Post-procedure details: wound care instructions given    Inflamed seborrheic keratosis left side  x 4 Reassured benign age-related growth.  Recommend observation.  Discussed cryotherapy if spot(s) become irritated or inflamed.   Prior to procedure, discussed risks of blister formation, small wound, skin  dyspigmentation, or rare scar following cryotherapy. Recommend Vaseline ointment to treated areas while healing.   Destruction of lesion - left side x 4 Complexity: simple   Destruction method: cryotherapy   Informed consent: discussed and consent obtained   Timeout:  patient name, date of birth, surgical site, and procedure verified Lesion destroyed using liquid nitrogen: Yes   Region frozen until ice ball extended beyond lesion: Yes   Outcome: patient tolerated procedure well with no complications   Post-procedure details: wound care instructions given    Skin cancer screening  Lentigines - Scattered tan macules - Due to sun exposure - Benign-appearing, observe - Recommend daily broad spectrum sunscreen SPF 30+ to sun-exposed areas, reapply every 2 hours as needed. - Call for any changes  Seborrheic Keratoses - Stuck-on, waxy, tan-brown papules and/or plaques  - Benign-appearing - Discussed benign etiology and prognosis. - Observe - Call for any changes  Melanocytic Nevi - Tan-brown and/or pink-flesh-colored symmetric macules and papules - Benign appearing on exam today - Observation - Call clinic for new or changing moles - Recommend daily use of broad spectrum spf 30+ sunscreen to sun-exposed areas.   Hemangiomas - Red papules - Discussed benign nature - Observe - Call for any changes  Actinic Damage - Chronic condition, secondary to cumulative UV/sun exposure - diffuse scaly erythematous macules with underlying dyspigmentation - Recommend daily broad spectrum sunscreen SPF 30+ to sun-exposed areas, reapply every 2 hours as needed.  - Staying in the shade or wearing long sleeves, sun glasses (UVA+UVB protection) and wide brim hats (4-inch brim around the entire circumference of the hat) are also recommended for sun protection.  - Call for new or changing lesions.  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  - Call if any new or changing lesions are noted between office visits  Skin cancer screening performed today.   Return in about 1 year (around 09/24/2022) for TBSE, hx of SCCis .  IMarye Round, CMA, am acting as scribe for Sarina Ser, MD .  Documentation: I have reviewed the above documentation for accuracy and completeness, and I agree with the above.  Sarina Ser, MD

## 2021-09-24 NOTE — Patient Instructions (Addendum)
Wound Care Instructions  Cleanse wound gently with soap and water once a day then pat dry with clean gauze. Apply a thing coat of Petrolatum (petroleum jelly, "Vaseline") over the wound (unless you have an allergy to this). We recommend that you use a new, sterile tube of Vaseline. Do not pick or remove scabs. Do not remove the yellow or white "healing tissue" from the base of the wound.  Cover the wound with fresh, clean, nonstick gauze and secure with paper tape. You may use Band-Aids in place of gauze and tape if the would is small enough, but would recommend trimming much of the tape off as there is often too much. Sometimes Band-Aids can irritate the skin.  You should call the office for your biopsy report after 1 week if you have not already been contacted.  If you experience any problems, such as abnormal amounts of bleeding, swelling, significant bruising, significant pain, or evidence of infection, please call the office immediately.  FOR ADULT SURGERY PATIENTS: If you need something for pain relief you may take 1 extra strength Tylenol (acetaminophen) AND 2 Ibuprofen (200mg  each) together every 4 hours as needed for pain. (do not take these if you are allergic to them or if you have a reason you should not take them.) Typically, you may only need pain medication for 1 to 3 days.        Cryotherapy Aftercare  Wash gently with soap and water everyday.   Apply Vaseline and Band-Aid daily until healed.  Prior to procedure, discussed risks of blister formation, small wound, skin dyspigmentation, or rare scar following cryotherapy. Recommend Vaseline ointment to treated areas while healing.     If you have any questions or concerns for your doctor, please call our main line at [phone] and press option 4 to reach your doctor's medical assistant. If no one answers, please leave a voicemail as directed and we will return your call as soon as possible. Messages left after 4 pm will be  answered the following business day.   You may also send [place] a message via [place]. We typically respond to MyChart messages within 1-2 business days.  For prescription refills, please ask your pharmacy to contact our office. Our fax number is [phone].  If you have an urgent issue when the clinic is closed that cannot wait until the next business day, you can page your doctor at the number below.    Please note that while we do our best to be available for urgent issues outside of office hours, we are not available 24/7.   If you have an urgent issue and are unable to reach [place], you may choose to seek medical care at your doctor's office, retail clinic, urgent care center, or emergency room.  If you have a medical emergency, please immediately call 911 or go to the emergency department.  Pager Numbers  - Dr. Nehemiah Massed: [phone]  - Dr. Laurence Ferrari: [phone]  - Dr. Nicole Kindred: [phone]  In the event of inclement weather, please call our main line at [phone] for an update on the status of any delays or closures.  Dermatology Medication Tips: Please keep the boxes that topical medications come in in order to help keep track of the instructions about where and how to use these. Pharmacies typically print the medication instructions only on the boxes and not directly on the medication tubes.   If your medication is too expensive, please contact our office at [phone] option 4 or send  [place] a message through [place].   We are unable to tell what your co-pay for medications will be in advance as this is different depending on your insurance coverage. However, we may be able to find a substitute medication at lower cost or fill out paperwork to get insurance to cover a needed medication.   If a prior authorization is required to get your medication covered by your insurance company, please allow [place] 1-2 business days to complete this process.  Drug prices often vary depending on  where the prescription is filled and some pharmacies may offer cheaper prices.  The website www.goodrx.com contains coupons for medications through different pharmacies. The prices here do not account for what the cost may be with help from insurance (it may be cheaper with your insurance), but the website can give you the price if you did not use any insurance.  - You can print the associated coupon and take it with your prescription to the pharmacy.  - You may also stop by our office during regular business hours and pick up a GoodRx coupon card.  - If you need your prescription sent electronically to a different pharmacy, notify our office through [facility] or by phone at [phone] option 4.

## 2021-09-26 ENCOUNTER — Telehealth: Payer: Self-pay

## 2021-09-26 NOTE — Telephone Encounter (Signed)
Patient informed of pathology results 

## 2021-09-26 NOTE — Telephone Encounter (Signed)
-----   Message from Ralene Bathe, MD sent at 09/26/2021  9:54 AM EST ----- Diagnosis Skin , left temple SQUAMOUS CELL CARCINOMA IN SITU, HYPERTROPHIC, BASE INVOLVED  Cancer - SCC in situ Superficial Already treated Recheck next visit

## 2021-10-14 DIAGNOSIS — M9903 Segmental and somatic dysfunction of lumbar region: Secondary | ICD-10-CM | POA: Diagnosis not present

## 2021-10-14 DIAGNOSIS — M25552 Pain in left hip: Secondary | ICD-10-CM | POA: Diagnosis not present

## 2021-10-14 DIAGNOSIS — M461 Sacroiliitis, not elsewhere classified: Secondary | ICD-10-CM | POA: Diagnosis not present

## 2021-10-14 DIAGNOSIS — M5451 Vertebrogenic low back pain: Secondary | ICD-10-CM | POA: Diagnosis not present

## 2021-10-14 DIAGNOSIS — M9904 Segmental and somatic dysfunction of sacral region: Secondary | ICD-10-CM | POA: Diagnosis not present

## 2021-10-16 DIAGNOSIS — M461 Sacroiliitis, not elsewhere classified: Secondary | ICD-10-CM | POA: Diagnosis not present

## 2021-10-16 DIAGNOSIS — M9904 Segmental and somatic dysfunction of sacral region: Secondary | ICD-10-CM | POA: Diagnosis not present

## 2021-10-16 DIAGNOSIS — M25552 Pain in left hip: Secondary | ICD-10-CM | POA: Diagnosis not present

## 2021-10-16 DIAGNOSIS — M9903 Segmental and somatic dysfunction of lumbar region: Secondary | ICD-10-CM | POA: Diagnosis not present

## 2021-10-16 DIAGNOSIS — M5451 Vertebrogenic low back pain: Secondary | ICD-10-CM | POA: Diagnosis not present

## 2021-10-23 DIAGNOSIS — M9903 Segmental and somatic dysfunction of lumbar region: Secondary | ICD-10-CM | POA: Diagnosis not present

## 2021-10-23 DIAGNOSIS — M5451 Vertebrogenic low back pain: Secondary | ICD-10-CM | POA: Diagnosis not present

## 2021-10-23 DIAGNOSIS — M9904 Segmental and somatic dysfunction of sacral region: Secondary | ICD-10-CM | POA: Diagnosis not present

## 2021-10-23 DIAGNOSIS — M461 Sacroiliitis, not elsewhere classified: Secondary | ICD-10-CM | POA: Diagnosis not present

## 2021-10-23 DIAGNOSIS — M25552 Pain in left hip: Secondary | ICD-10-CM | POA: Diagnosis not present

## 2021-11-13 DIAGNOSIS — M9904 Segmental and somatic dysfunction of sacral region: Secondary | ICD-10-CM | POA: Diagnosis not present

## 2021-11-13 DIAGNOSIS — M461 Sacroiliitis, not elsewhere classified: Secondary | ICD-10-CM | POA: Diagnosis not present

## 2021-11-13 DIAGNOSIS — M25552 Pain in left hip: Secondary | ICD-10-CM | POA: Diagnosis not present

## 2021-11-13 DIAGNOSIS — M5451 Vertebrogenic low back pain: Secondary | ICD-10-CM | POA: Diagnosis not present

## 2021-11-13 DIAGNOSIS — M9903 Segmental and somatic dysfunction of lumbar region: Secondary | ICD-10-CM | POA: Diagnosis not present

## 2022-01-23 DIAGNOSIS — M65871 Other synovitis and tenosynovitis, right ankle and foot: Secondary | ICD-10-CM | POA: Diagnosis not present

## 2022-01-23 DIAGNOSIS — Z7901 Long term (current) use of anticoagulants: Secondary | ICD-10-CM | POA: Diagnosis not present

## 2022-01-23 DIAGNOSIS — I4819 Other persistent atrial fibrillation: Secondary | ICD-10-CM | POA: Diagnosis not present

## 2022-01-23 DIAGNOSIS — M19071 Primary osteoarthritis, right ankle and foot: Secondary | ICD-10-CM | POA: Diagnosis not present

## 2022-01-23 DIAGNOSIS — M79671 Pain in right foot: Secondary | ICD-10-CM | POA: Diagnosis not present

## 2022-01-23 DIAGNOSIS — M659 Synovitis and tenosynovitis, unspecified: Secondary | ICD-10-CM | POA: Diagnosis not present

## 2022-02-11 DIAGNOSIS — Z0000 Encounter for general adult medical examination without abnormal findings: Secondary | ICD-10-CM | POA: Diagnosis not present

## 2022-02-11 DIAGNOSIS — K219 Gastro-esophageal reflux disease without esophagitis: Secondary | ICD-10-CM | POA: Diagnosis not present

## 2022-02-11 DIAGNOSIS — I10 Essential (primary) hypertension: Secondary | ICD-10-CM | POA: Diagnosis not present

## 2022-02-11 DIAGNOSIS — E782 Mixed hyperlipidemia: Secondary | ICD-10-CM | POA: Diagnosis not present

## 2022-02-11 DIAGNOSIS — R791 Abnormal coagulation profile: Secondary | ICD-10-CM | POA: Diagnosis not present

## 2022-02-11 DIAGNOSIS — M109 Gout, unspecified: Secondary | ICD-10-CM | POA: Diagnosis not present

## 2022-02-11 DIAGNOSIS — I4819 Other persistent atrial fibrillation: Secondary | ICD-10-CM | POA: Diagnosis not present

## 2022-02-11 DIAGNOSIS — Z125 Encounter for screening for malignant neoplasm of prostate: Secondary | ICD-10-CM | POA: Diagnosis not present

## 2022-02-18 DIAGNOSIS — M109 Gout, unspecified: Secondary | ICD-10-CM | POA: Diagnosis not present

## 2022-02-18 DIAGNOSIS — Z125 Encounter for screening for malignant neoplasm of prostate: Secondary | ICD-10-CM | POA: Diagnosis not present

## 2022-02-18 DIAGNOSIS — R791 Abnormal coagulation profile: Secondary | ICD-10-CM | POA: Diagnosis not present

## 2022-02-18 DIAGNOSIS — I10 Essential (primary) hypertension: Secondary | ICD-10-CM | POA: Diagnosis not present

## 2022-02-18 DIAGNOSIS — E782 Mixed hyperlipidemia: Secondary | ICD-10-CM | POA: Diagnosis not present

## 2022-02-24 ENCOUNTER — Ambulatory Visit (INDEPENDENT_AMBULATORY_CARE_PROVIDER_SITE_OTHER): Payer: PPO

## 2022-02-24 ENCOUNTER — Ambulatory Visit: Payer: PPO

## 2022-02-24 ENCOUNTER — Encounter: Payer: Self-pay | Admitting: Emergency Medicine

## 2022-02-24 ENCOUNTER — Ambulatory Visit
Admission: EM | Admit: 2022-02-24 | Discharge: 2022-02-24 | Disposition: A | Discharge status: Home / Self Care | Payer: PPO | Attending: Student | Admitting: Student

## 2022-02-24 DIAGNOSIS — R0602 Shortness of breath: Secondary | ICD-10-CM

## 2022-02-24 DIAGNOSIS — R051 Acute cough: Secondary | ICD-10-CM

## 2022-02-24 DIAGNOSIS — R059 Cough, unspecified: Secondary | ICD-10-CM

## 2022-02-24 DIAGNOSIS — R062 Wheezing: Secondary | ICD-10-CM | POA: Diagnosis not present

## 2022-02-24 MED ORDER — AMOXICILLIN-POT CLAVULANATE 875-125 MG PO TABS: 1.0000 | ORAL_TABLET | Freq: Two times a day (BID) | ORAL | 0 refills | Status: AC

## 2022-02-24 NOTE — Discharge Instructions (Addendum)
-  Start the antibiotic-Augmentin (amoxicillin-clavulanate), 1 pill every 12 hours for 7 days.  You can take this with food like with breakfast and dinner. ?-We will call you in 1 to 2 days if we need to change anything based on your chest x-ray result. ?-Albuterol inhaler as needed for cough, wheezing, shortness of breath, 1 to 2 puffs every 6 hours as needed. ?-Follow-up if symptoms get worse instead of better, including shortness of breath, chest pain, dizziness, weakness, fever/chills ?

## 2022-02-24 NOTE — ED Provider Notes (Signed)
?[facility] ? ? ? ?CSN: [id number] ?Arrival date & time: 02/24/22  1939 ? ? ?  ? ?History   ?Chief Complaint ?Chief Complaint  ?Patient presents with  ? Cough  ? Wheezing  ? ? ?HPI ?[name] is a 76 y.o. male presenting with cough and wheezing for 3 days.  History COVID-pneumonia.  States he has been wheezing, worse at night.  Symptoms are actually well controlled at time of visit.  Cough is nonproductive.  Denies fevers.  There is no associated chest pain, dizziness, weakness, nausea, vomiting, diarrhea.  Denies current shortness of breath.  Has not attempted medications at home.  Denies history of pulmonary disease, never smoker. ? ?HPI ? ?Past Medical History:  ?Diagnosis Date  ? Arthritis   ? Atrial fibrillation ([place])   ? GERD (gastroesophageal reflux disease)   ? Gout   ? Hyperlipidemia   ? Hypertension   ? Kidney stones   ? Squamous cell carcinoma in situ (SCCIS) of skin of neck 07/09/2014  ? Squamous cell carcinoma of skin 07/09/2014  ? Anterior neck, In situ  ? Squamous cell carcinoma of skin 09/24/2021  ? L temple - SCCIS  ? ? ?Patient Active Problem List  ? Diagnosis Date Noted  ? Pneumonia due to COVID-19 virus 10/23/2020  ? Rapid atrial fibrillation ([place]) 10/23/2020  ? Pneumonia 02/25/2016  ? Hypoxia 02/25/2016  ? Hyperlipidemia, mixed 09/07/2014  ? Hypertension 05/26/2014  ? Gout, joint 05/26/2014  ? Arthritis 05/26/2014  ? ? ?Past Surgical History:  ?Procedure Laterality Date  ? CARDIOVERSION N/A 12/03/2020  ? Procedure: CARDIOVERSION;  Surgeon: Isaias Cowman, MD;  Location: [facility] ORS;  Service: Cardiovascular;  Laterality: N/A;  ? CATARACT EXTRACTION    ? COLONOSCOPY WITH ESOPHAGOGASTRODUODENOSCOPY (EGD)    ? COLONOSCOPY WITH PROPOFOL N/A 01/20/2017  ? Procedure: COLONOSCOPY WITH PROPOFOL;  Surgeon: Manya Silvas, MD;  Location[facility] END[facility]COPY;  Service: Endoscopy;  Laterality: N/A;  ? REPLACEMENT TOTAL KNEE Right   ? ? ? ? ? ?Home Medications   ? ?Prior to Admission medications    ?Medication Sig Start Date End Date Taking? Authorizing Provider  ?amoxicillin-clavulanate (AUGMENTIN) 875-125 MG tablet Take 1 tablet by mouth every 12 (twelve) hours. 02/24/22  Yes Hazel Sams, PA-C  ?omeprazole (PRILOSEC) 20 MG capsule Take by mouth. 10/21/21  Yes [provider]  ?acetaminophen (TYLENOL) 500 MG tablet Take 1,000 mg by mouth every 8 (eight) hours as needed for moderate pain.    [provider]  ?apixaban (ELIQUIS) 5 MG TABS tablet Take 1 tablet (5 mg total) by mouth 2 (two) times daily. New blood thinner ordered by cardiology. 10/27/20 01/25/21  Enzo Bi, MD  ?guaiFENesin-dextromethorphan [facility] DM) 100-10 MG/5ML syrup Take 10 mLs by mouth every 4 (four) hours as needed for cough. ?Patient not taking: Reported on 05/01/2021 10/27/20   Enzo Bi, MD  ?lisinopril (ZESTRIL) 20 MG tablet Hold until followup with outpatient doctor because your blood pressure has been low normal without it. ?Patient not taking: No sig reported 10/27/20   Enzo Bi, MD  ?lovastatin (MEVACOR) 40 MG tablet Take 40 mg by mouth every evening. 02/18/16   [provider]  ?metoprolol tartrate (LOPRESSOR) 50 MG tablet Take 1 tablet (50 mg total) by mouth 2 (two) times daily. To control your heart rate. 10/27/20 01/25/21  Enzo Bi, MD  ?Multiple Vitamin (MULTIVITAMIN WITH MINERALS) TABS tablet Take 1 tablet by mouth daily.    [provider]  ?zinc sulfate 220 (  50 Zn) MG capsule Take 220 mg by mouth daily.    [provider]  ? ? ?Family History ?Family History  ?Problem Relation Age of Onset  ? Cancer Mother   ? Liver cancer Father   ? Diabetes Father   ? ? ?Social History ?Social History  ? ?Tobacco Use  ? Smoking status: Never  ? Smokeless tobacco: Never  ?Vaping Use  ? Vaping Use: Never used  ?Substance Use Topics  ? Alcohol use: No  ? Drug use: No  ? ? ? ?Allergies   ?Lipitor [atorvastatin], Sulfa antibiotics, and Zocor [simvastatin] ? ? ?Review of Systems ?Review of  Systems  ?Constitutional:  Negative for appetite change, chills and fever.  ?HENT:  Positive for congestion. Negative for ear pain, rhinorrhea, sinus pressure, sinus pain and sore throat.   ?Eyes:  Negative for redness and visual disturbance.  ?Respiratory:  Positive for cough and wheezing. Negative for chest tightness and shortness of breath.   ?Cardiovascular:  Negative for chest pain and palpitations.  ?Gastrointestinal:  Negative for abdominal pain, constipation, diarrhea, nausea and vomiting.  ?Genitourinary:  Negative for dysuria, frequency and urgency.  ?Musculoskeletal:  Negative for myalgias.  ?Neurological:  Negative for dizziness, weakness and headaches.  ?Psychiatric/Behavioral:  Negative for confusion.   ?All other systems reviewed and are negative. ? ? ?Physical Exam ?Triage Vital Signs ?ED Triage Vitals  ?Enc Vitals Group  ?   BP 02/24/22 1947 (!) 151/100  ?   Pulse Rate 02/24/22 1947 95  ?   Resp 02/24/22 1947 18  ?   Temp 02/24/22 1947 98.3 ?F (36.8 ?C)  ?   Temp Source 02/24/22 1947 Oral  ?   SpO2 02/24/22 1947 94 %  ?   Weight --   ?   Height --   ?   Head Circumference --   ?   Peak Flow --   ?   Pain Score 02/24/22 1945 0  ?   Pain Loc --   ?   Pain Edu? --   ?   Excl. in [place]? --   ? ?No data found. ? ?Updated Vital Signs ?BP (!) 151/100 (BP Location: Right Arm)   Pulse 95   Temp 98.3 ?F (36.8 ?C) (Oral)   Resp 18   SpO2 94%  ? ?Visual Acuity ?Right Eye Distance:   ?Left Eye Distance:   ?Bilateral Distance:   ? ?Right Eye Near:   ?Left Eye Near:    ?Bilateral Near:    ? ?Physical Exam ?Vitals reviewed.  ?Constitutional:   ?   General: He is not in acute distress. ?   Appearance: Normal appearance. He is not ill-appearing.  ?HENT:  ?   Head: Normocephalic and atraumatic.  ?   Right Ear: Tympanic membrane, ear canal and external ear normal. No tenderness. No middle ear effusion. There is no impacted cerumen. Tympanic membrane is not perforated, erythematous, retracted or bulging.  ?   Left Ear:  Tympanic membrane, ear canal and external ear normal. No tenderness.  No middle ear effusion. There is no impacted cerumen. Tympanic membrane is not perforated, erythematous, retracted or bulging.  ?   Nose: Nose normal. No congestion.  ?   Mouth/Throat:  ?   Mouth: Mucous membranes are moist.  ?   Pharynx: Uvula midline. No oropharyngeal exudate or posterior oropharyngeal erythema.  ?Eyes:  ?   Extraocular Movements: Extraocular movements intact.  ?   Pupils: Pupils are equal, round, and reactive to  light.  ?Cardiovascular:  ?   Rate and Rhythm: Normal rate and regular rhythm.  ?   Heart sounds: Normal heart sounds.  ?Pulmonary:  ?   Effort: Pulmonary effort is normal.  ?   Breath sounds: Normal breath sounds. No decreased breath sounds, wheezing, rhonchi or rales.  ?Abdominal:  ?   Palpations: Abdomen is soft.  ?   Tenderness: There is no abdominal tenderness. There is no guarding or rebound.  ?Lymphadenopathy:  ?   Cervical: No cervical adenopathy.  ?   Right cervical: No superficial cervical adenopathy. ?   Left cervical: No superficial cervical adenopathy.  ?Neurological:  ?   General: No focal deficit present.  ?   Mental Status: He is alert and oriented to person, place, and time.  ?Psychiatric:     ?   Mood and Affect: Mood normal.     ?   Behavior: Behavior normal.     ?   Thought Content: Thought content normal.     ?   Judgment: Judgment normal.  ? ? ? ?UC Treatments / Results  ?Labs ?(all labs ordered are listed, but only abnormal results are displayed) ?Labs Reviewed - No data to display ? ?EKG ? ? ?Radiology ?DG Chest 2 View ? ?Result Date: 02/24/2022 ?CLINICAL DATA:  Wheezing and productive cough for several days EXAM: CHEST - 2 VIEW COMPARISON:  10/24/2019 FINDINGS: Cardiac shadow is enlarged but stable. The lungs are well aerated bilaterally. No focal infiltrate or effusion is seen. Nodular densities are noted in the midportion of the left lung measuring up to 14 mm. No bony abnormality is noted.  IMPRESSION: No focal infiltrate is noted. Some nodularity is noted in the midportion of the left lung. CT of the chest with contrast is recommended for further evaluation. Electronically Signed   By: Oneida Arenas

## 2022-02-24 NOTE — ED Triage Notes (Signed)
Pt presents with cough and wheezing x 3 days  ?

## 2022-02-27 DIAGNOSIS — J22 Unspecified acute lower respiratory infection: Secondary | ICD-10-CM | POA: Diagnosis not present

## 2022-02-27 DIAGNOSIS — I10 Essential (primary) hypertension: Secondary | ICD-10-CM | POA: Diagnosis not present

## 2022-02-27 DIAGNOSIS — I517 Cardiomegaly: Secondary | ICD-10-CM | POA: Diagnosis not present

## 2022-02-27 DIAGNOSIS — R051 Acute cough: Secondary | ICD-10-CM | POA: Diagnosis not present

## 2022-02-27 DIAGNOSIS — Z03818 Encounter for observation for suspected exposure to other biological agents ruled out: Secondary | ICD-10-CM | POA: Diagnosis not present

## 2022-02-27 DIAGNOSIS — R059 Cough, unspecified: Secondary | ICD-10-CM | POA: Diagnosis not present

## 2022-02-27 DIAGNOSIS — J40 Bronchitis, not specified as acute or chronic: Secondary | ICD-10-CM | POA: Diagnosis not present

## 2022-03-04 DIAGNOSIS — Z7901 Long term (current) use of anticoagulants: Secondary | ICD-10-CM | POA: Diagnosis not present

## 2022-03-04 DIAGNOSIS — M65871 Other synovitis and tenosynovitis, right ankle and foot: Secondary | ICD-10-CM | POA: Diagnosis not present

## 2022-03-11 DIAGNOSIS — R059 Cough, unspecified: Secondary | ICD-10-CM | POA: Diagnosis not present

## 2022-03-12 ENCOUNTER — Other Ambulatory Visit: Payer: Self-pay | Admitting: Family Medicine

## 2022-03-12 DIAGNOSIS — R911 Solitary pulmonary nodule: Secondary | ICD-10-CM

## 2022-03-25 ENCOUNTER — Ambulatory Visit
Admission: RE | Admit: 2022-03-25 | Discharge: 2022-03-25 | Disposition: A | Discharge status: Home / Self Care | Payer: PPO | Source: Ambulatory Visit | Attending: Family Medicine | Admitting: Family Medicine

## 2022-03-25 DIAGNOSIS — D71 Functional disorders of polymorphonuclear neutrophils: Secondary | ICD-10-CM | POA: Diagnosis not present

## 2022-03-25 DIAGNOSIS — R911 Solitary pulmonary nodule: Secondary | ICD-10-CM | POA: Insufficient documentation

## 2022-03-25 DIAGNOSIS — I517 Cardiomegaly: Secondary | ICD-10-CM | POA: Diagnosis not present

## 2022-03-25 DIAGNOSIS — R918 Other nonspecific abnormal finding of lung field: Secondary | ICD-10-CM | POA: Diagnosis not present

## 2022-03-25 DIAGNOSIS — J8410 Pulmonary fibrosis, unspecified: Secondary | ICD-10-CM | POA: Diagnosis not present

## 2022-03-25 LAB — POCT I-STAT CREATININE: Creatinine, Ser: 1 mg/dL (ref 0.61–1.24)

## 2022-03-25 MED ORDER — IOHEXOL 300 MG/ML  SOLN
75.0000 mL | Freq: Once | INTRAMUSCULAR | Status: AC | PRN
  Administered 2022-03-25: 75 mL via INTRAVENOUS

## 2022-05-26 IMAGING — DX DG CHEST 1V PORT
1 series · 1 of 1 positions shown · non-contrast
Comparison: 10/23/2020

CLINICAL DATA: Productive cough COVID positive

EXAM:
PORTABLE CHEST 1 VIEW

[chest ap]
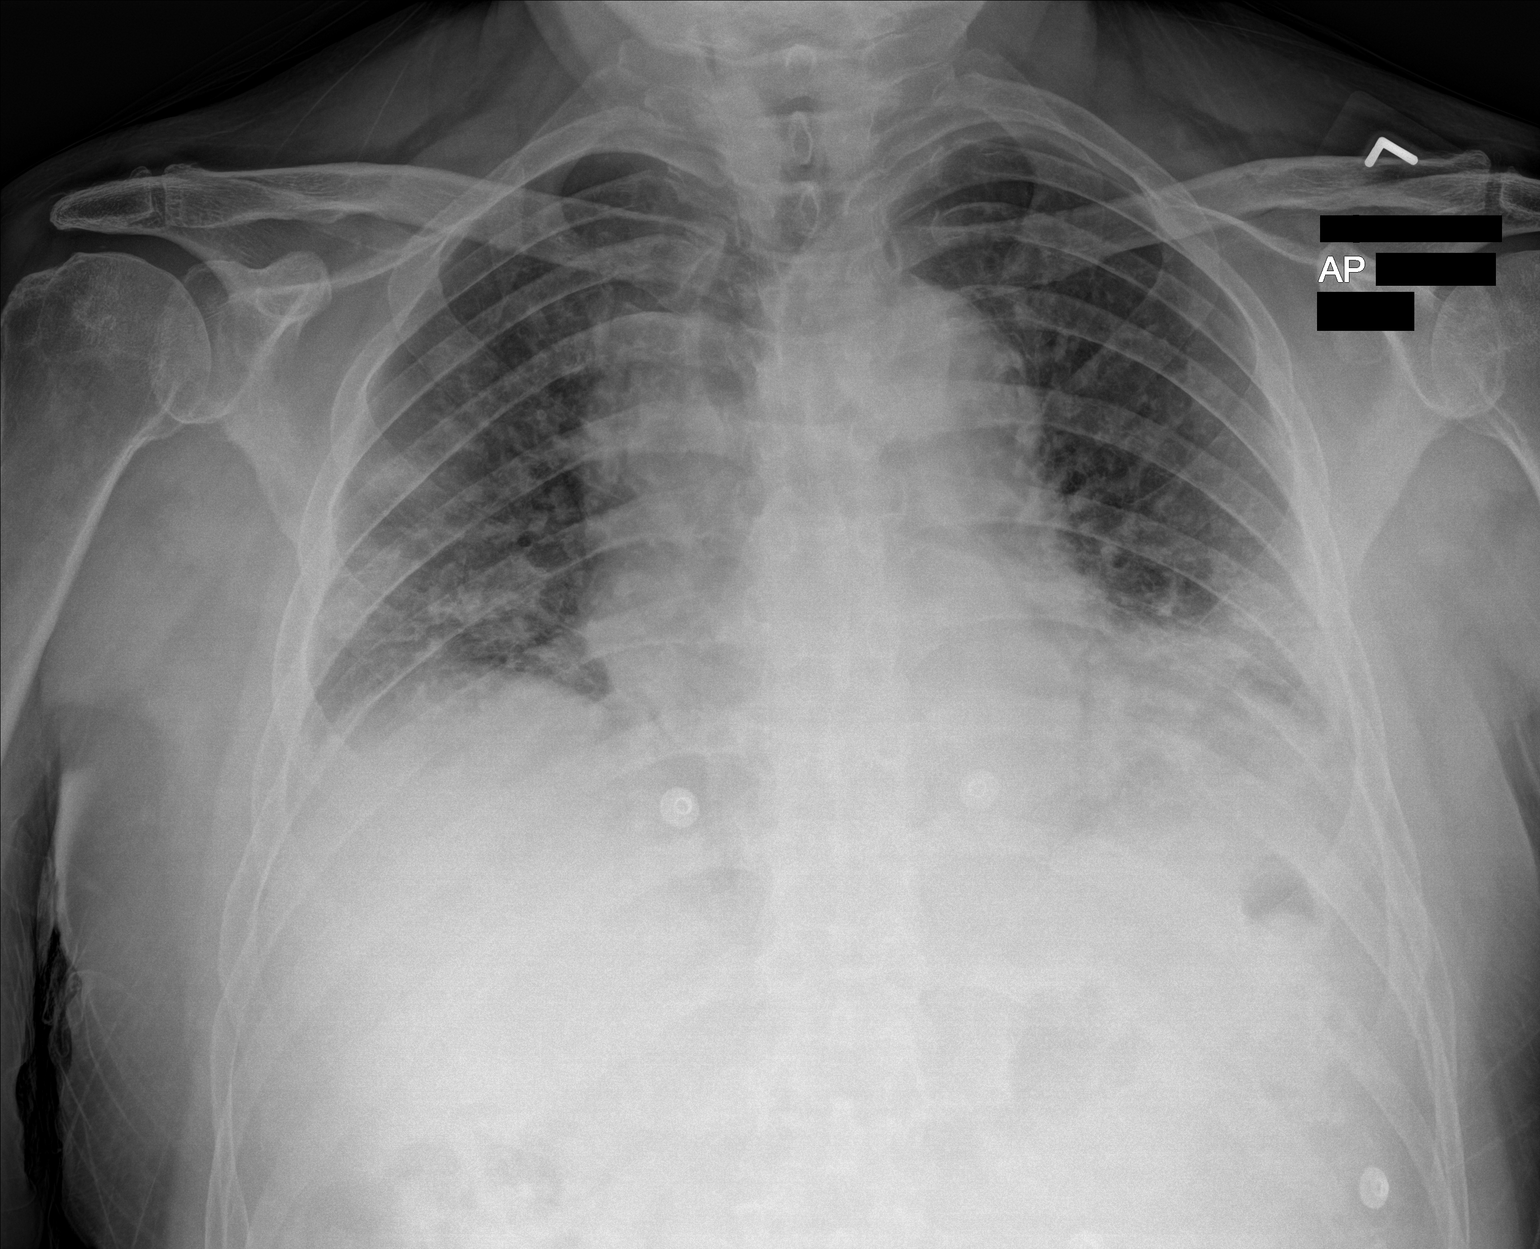

[1 of 1 positions shown; findings below may reference images not displayed]

FINDINGS: Patchy bilateral mid to lower lung opacities. No pleural effusion.
Mild cardiomegaly. No pneumothorax.
IMPRESSION: Patchy bilateral mid to lower lung opacities suspicious for
bilateral pneumonia. This does not appear significantly changed.

## 2022-07-14 DIAGNOSIS — Z961 Presence of intraocular lens: Secondary | ICD-10-CM | POA: Diagnosis not present

## 2022-08-14 DIAGNOSIS — I4819 Other persistent atrial fibrillation: Secondary | ICD-10-CM | POA: Diagnosis not present

## 2022-08-14 DIAGNOSIS — Z23 Encounter for immunization: Secondary | ICD-10-CM | POA: Diagnosis not present

## 2022-08-14 DIAGNOSIS — E782 Mixed hyperlipidemia: Secondary | ICD-10-CM | POA: Diagnosis not present

## 2022-08-14 DIAGNOSIS — I10 Essential (primary) hypertension: Secondary | ICD-10-CM | POA: Diagnosis not present

## 2022-08-14 DIAGNOSIS — K219 Gastro-esophageal reflux disease without esophagitis: Secondary | ICD-10-CM | POA: Diagnosis not present

## 2022-08-14 DIAGNOSIS — M109 Gout, unspecified: Secondary | ICD-10-CM | POA: Diagnosis not present

## 2022-08-19 DIAGNOSIS — I10 Essential (primary) hypertension: Secondary | ICD-10-CM | POA: Diagnosis not present

## 2022-08-19 DIAGNOSIS — E782 Mixed hyperlipidemia: Secondary | ICD-10-CM | POA: Diagnosis not present

## 2022-08-19 DIAGNOSIS — I4819 Other persistent atrial fibrillation: Secondary | ICD-10-CM | POA: Diagnosis not present

## 2022-09-22 DIAGNOSIS — M9904 Segmental and somatic dysfunction of sacral region: Secondary | ICD-10-CM | POA: Diagnosis not present

## 2022-09-22 DIAGNOSIS — M461 Sacroiliitis, not elsewhere classified: Secondary | ICD-10-CM | POA: Diagnosis not present

## 2022-09-22 DIAGNOSIS — M9903 Segmental and somatic dysfunction of lumbar region: Secondary | ICD-10-CM | POA: Diagnosis not present

## 2022-09-22 DIAGNOSIS — M25551 Pain in right hip: Secondary | ICD-10-CM | POA: Diagnosis not present

## 2022-09-22 DIAGNOSIS — M5451 Vertebrogenic low back pain: Secondary | ICD-10-CM | POA: Diagnosis not present

## 2022-09-24 DIAGNOSIS — M9903 Segmental and somatic dysfunction of lumbar region: Secondary | ICD-10-CM | POA: Diagnosis not present

## 2022-09-24 DIAGNOSIS — M25551 Pain in right hip: Secondary | ICD-10-CM | POA: Diagnosis not present

## 2022-09-24 DIAGNOSIS — M461 Sacroiliitis, not elsewhere classified: Secondary | ICD-10-CM | POA: Diagnosis not present

## 2022-09-24 DIAGNOSIS — M5451 Vertebrogenic low back pain: Secondary | ICD-10-CM | POA: Diagnosis not present

## 2022-09-24 DIAGNOSIS — M9904 Segmental and somatic dysfunction of sacral region: Secondary | ICD-10-CM | POA: Diagnosis not present

## 2022-09-30 ENCOUNTER — Ambulatory Visit: Payer: PPO | Admitting: Dermatology

## 2022-09-30 DIAGNOSIS — L814 Other melanin hyperpigmentation: Secondary | ICD-10-CM

## 2022-09-30 DIAGNOSIS — L821 Other seborrheic keratosis: Secondary | ICD-10-CM | POA: Diagnosis not present

## 2022-09-30 DIAGNOSIS — L905 Scar conditions and fibrosis of skin: Secondary | ICD-10-CM | POA: Diagnosis not present

## 2022-09-30 DIAGNOSIS — Z1283 Encounter for screening for malignant neoplasm of skin: Secondary | ICD-10-CM

## 2022-09-30 DIAGNOSIS — L578 Other skin changes due to chronic exposure to nonionizing radiation: Secondary | ICD-10-CM

## 2022-09-30 DIAGNOSIS — D229 Melanocytic nevi, unspecified: Secondary | ICD-10-CM | POA: Diagnosis not present

## 2022-09-30 DIAGNOSIS — Z85828 Personal history of other malignant neoplasm of skin: Secondary | ICD-10-CM | POA: Diagnosis not present

## 2022-09-30 DIAGNOSIS — Z8589 Personal history of malignant neoplasm of other organs and systems: Secondary | ICD-10-CM

## 2022-09-30 NOTE — Patient Instructions (Signed)
Due to recent changes in healthcare laws, you may see results of your pathology and/or laboratory studies on MyChart before the doctors have had a chance to review them. We understand that in some cases there may be results that are confusing or concerning to you. Please understand that not all results are received at the same time and often the doctors may need to interpret multiple results in order to provide you with the best plan of care or course of treatment. Therefore, we ask that you please give us 2 business days to thoroughly review all your results before contacting the office for clarification. Should we see a critical lab result, you will be contacted sooner.   If You Need Anything After Your Visit  If you have any questions or concerns for your doctor, please call our main line at [phone] and press option 4 to reach your doctor's medical assistant. If no one answers, please leave a voicemail as directed and we will return your call as soon as possible. Messages left after 4 pm will be answered the following business day.   You may also send us a message via MyChart. We typically respond to MyChart messages within 1-2 business days.  For prescription refills, please ask your pharmacy to contact our office. Our fax number is [phone].  If you have an urgent issue when the clinic is closed that cannot wait until the next business day, you can page your doctor at the number below.    Please note that while we do our best to be available for urgent issues outside of office hours, we are not available 24/7.   If you have an urgent issue and are unable to reach us, you may choose to seek medical care at your doctor's office, retail clinic, urgent care center, or emergency room.  If you have a medical emergency, please immediately call 911 or go to the emergency department.  Pager Numbers  - Dr. Kowalski: [phone]  - Dr. Moye: [phone]  - Dr. Stewart:  [phone]  In the event of inclement weather, please call our main line at [phone] for an update on the status of any delays or closures.  Dermatology Medication Tips: Please keep the boxes that topical medications come in in order to help keep track of the instructions about where and how to use these. Pharmacies typically print the medication instructions only on the boxes and not directly on the medication tubes.   If your medication is too expensive, please contact our office at [phone] option 4 or send us a message through MyChart.   We are unable to tell what your co-pay for medications will be in advance as this is different depending on your insurance coverage. However, we may be able to find a substitute medication at lower cost or fill out paperwork to get insurance to cover a needed medication.   If a prior authorization is required to get your medication covered by your insurance company, please allow us 1-2 business days to complete this process.  Drug prices often vary depending on where the prescription is filled and some pharmacies may offer cheaper prices.  The website www.goodrx.com contains coupons for medications through different pharmacies. The prices here do not account for what the cost may be with help from insurance (it may be cheaper with your insurance), but the website can give you the price if you did not use any insurance.  - You can print the associated coupon and take it with   your prescription to the pharmacy.  - You may also stop by our office during regular business hours and pick up a GoodRx coupon card.  - If you need your prescription sent electronically to a different pharmacy, notify our office through [place] MyChart or by phone at [phone] option 4.     Si Usted Necesita Algo Despus de Su Visita  Tambin puede enviarnos un mensaje a travs de MyChart. Por lo general respondemos a los mensajes de MyChart en el transcurso de 1 a 2  das hbiles.  Para renovar recetas, por favor pida a su farmacia que se ponga en contacto con nuestra oficina. Nuestro nmero de fax es el [phone].  Si tiene un asunto urgente cuando la clnica est cerrada y que no puede esperar hasta el siguiente da hbil, puede llamar/localizar a su doctor(a) al nmero que aparece a continuacin.   Por favor, tenga en cuenta que aunque hacemos todo lo posible para estar disponibles para asuntos urgentes fuera del horario de oficina, no estamos disponibles las 24 horas del da, los 7 das de la semana.   Si tiene un problema urgente y no puede comunicarse con nosotros, puede optar por buscar atencin mdica  en el consultorio de su doctor(a), en una clnica privada, en un centro de atencin urgente o en una sala de emergencias.  Si tiene una emergencia mdica, por favor llame inmediatamente al 911 o vaya a la sala de emergencias.  Nmeros de bper  - Dr. Kowalski: [phone]  - Dra. Moye: [phone]  - Dra. Stewart: [phone]  En caso de inclemencias del tiempo, por favor llame a nuestra lnea principal al [phone] para una actualizacin sobre el estado de cualquier retraso o cierre.  Consejos para la medicacin en dermatologa: Por favor, guarde las cajas en las que vienen los medicamentos de uso tpico para ayudarle a seguir las instrucciones sobre dnde y cmo usarlos. Las farmacias generalmente imprimen las instrucciones del medicamento slo en las cajas y no directamente en los tubos del medicamento.   Si su medicamento es muy caro, por favor, pngase en contacto con nuestra oficina llamando al [phone] y presione la opcin 4 o envenos un mensaje a travs de MyChart.   No podemos decirle cul ser su copago por los medicamentos por adelantado ya que esto es diferente dependiendo de la cobertura de su seguro. Sin embargo, es posible que podamos encontrar un medicamento sustituto a menor costo o llenar un formulario para que el  seguro cubra el medicamento que se considera necesario.   Si se requiere una autorizacin previa para que su compaa de seguros cubra su medicamento, por favor permtanos de 1 a 2 das hbiles para completar este proceso.  Los precios de los medicamentos varan con frecuencia dependiendo del lugar de dnde se surte la receta y alguna farmacias pueden ofrecer precios ms baratos.  El sitio web www.goodrx.com tiene cupones para medicamentos de diferentes farmacias. Los precios aqu no tienen en cuenta lo que podra costar con la ayuda del seguro (puede ser ms barato con su seguro), pero el sitio web puede darle el precio si no utiliz ningn seguro.  - Puede imprimir el cupn correspondiente y llevarlo con su receta a la farmacia.  - Tambin puede pasar por nuestra oficina durante el horario de atencin regular y recoger una tarjeta de cupones de GoodRx.  - Si necesita que su receta se enve electrnicamente a una farmacia diferente, informe a nuestra oficina a travs de MyChart de [place]   o por telfono llamando al [phone] y presione la opcin 4.  

## 2022-09-30 NOTE — Progress Notes (Signed)
   Follow-Up Visit   Subjective  [name] is a 76 y.o. male who presents for the following: Annual Exam (Hx SCC ). The patient presents for Total-Body Skin Exam (TBSE) for skin cancer screening and mole check.  The patient has spots, moles and lesions to be evaluated, some may be new or changing and the patient has concerns that these could be cancer.  The following portions of the chart were reviewed this encounter and updated as appropriate:   Tobacco  Allergies  Meds  Problems  Med Hx  Surg Hx  Fam Hx     Review of Systems:  No other skin or systemic complaints except as noted in HPI or Assessment and Plan.  Objective  Well appearing patient in no apparent distress; mood and affect are within normal limits.  A full examination was performed including scalp, head, eyes, ears, nose, lips, neck, chest, axillae, abdomen, back, buttocks, bilateral upper extremities, bilateral lower extremities, hands, feet, fingers, toes, fingernails, and toenails. All findings within normal limits unless otherwise noted below.  L nose Indented scar.   Assessment & Plan  Scar conditions and fibrosis of skin L nose Previous skin surgery site - patient states not cancerous. Benign-appearing.  Observation.  Call clinic for new or changing lesions.  Recommend daily use of broad spectrum spf 30+ sunscreen to sun-exposed areas.    History of Squamous Cell Carcinoma of the Skin - No evidence of recurrence today - No lymphadenopathy - Recommend regular full body skin exams - Recommend daily broad spectrum sunscreen SPF 30+ to sun-exposed areas, reapply every 2 hours as needed.  - Call if any new or changing lesions are noted between office visits  Lentigines - Scattered tan macules - Due to sun exposure - Benign-appearing, observe - Recommend daily broad spectrum sunscreen SPF 30+ to sun-exposed areas, reapply every 2 hours as needed. - Call for any changes  Seborrheic Keratoses -  Stuck-on, waxy, tan-brown papules and/or plaques  - Benign-appearing - Discussed benign etiology and prognosis. - Observe - Call for any changes  Melanocytic Nevi - Tan-brown and/or pink-flesh-colored symmetric macules and papules - Benign appearing on exam today - Observation - Call clinic for new or changing moles - Recommend daily use of broad spectrum spf 30+ sunscreen to sun-exposed areas.   Hemangiomas - Red papules - Discussed benign nature - Observe - Call for any changes  Actinic Damage - Chronic condition, secondary to cumulative UV/sun exposure - diffuse scaly erythematous macules with underlying dyspigmentation - Recommend daily broad spectrum sunscreen SPF 30+ to sun-exposed areas, reapply every 2 hours as needed.  - Staying in the shade or wearing long sleeves, sun glasses (UVA+UVB protection) and wide brim hats (4-inch brim around the entire circumference of the hat) are also recommended for sun protection.  - Call for new or changing lesions.  Skin cancer screening performed today.  Return in about 1 year (around 10/01/2023) for TBSE.  Luther Redo, CMA, am acting as scribe for Sarina Ser, MD . Documentation: I have reviewed the above documentation for accuracy and completeness, and I agree with the above.  Sarina Ser, MD

## 2022-10-13 ENCOUNTER — Encounter: Payer: Self-pay | Admitting: Dermatology

## 2022-11-12 DIAGNOSIS — M9904 Segmental and somatic dysfunction of sacral region: Secondary | ICD-10-CM | POA: Diagnosis not present

## 2022-11-12 DIAGNOSIS — M5451 Vertebrogenic low back pain: Secondary | ICD-10-CM | POA: Diagnosis not present

## 2022-11-12 DIAGNOSIS — M9903 Segmental and somatic dysfunction of lumbar region: Secondary | ICD-10-CM | POA: Diagnosis not present

## 2022-11-12 DIAGNOSIS — M461 Sacroiliitis, not elsewhere classified: Secondary | ICD-10-CM | POA: Diagnosis not present

## 2022-11-12 DIAGNOSIS — M25551 Pain in right hip: Secondary | ICD-10-CM | POA: Diagnosis not present

## 2022-11-17 DIAGNOSIS — M9904 Segmental and somatic dysfunction of sacral region: Secondary | ICD-10-CM | POA: Diagnosis not present

## 2022-11-17 DIAGNOSIS — M461 Sacroiliitis, not elsewhere classified: Secondary | ICD-10-CM | POA: Diagnosis not present

## 2022-11-17 DIAGNOSIS — M5451 Vertebrogenic low back pain: Secondary | ICD-10-CM | POA: Diagnosis not present

## 2022-11-17 DIAGNOSIS — M25551 Pain in right hip: Secondary | ICD-10-CM | POA: Diagnosis not present

## 2022-11-17 DIAGNOSIS — M9903 Segmental and somatic dysfunction of lumbar region: Secondary | ICD-10-CM | POA: Diagnosis not present

## 2022-12-02 DIAGNOSIS — M25561 Pain in right knee: Secondary | ICD-10-CM | POA: Diagnosis not present

## 2022-12-02 DIAGNOSIS — G8929 Other chronic pain: Secondary | ICD-10-CM | POA: Diagnosis not present

## 2023-02-12 ENCOUNTER — Ambulatory Visit
Admission: EM | Admit: 2023-02-12 | Discharge: 2023-02-12 | Disposition: A | Discharge status: Home / Self Care | Payer: PPO | Attending: Emergency Medicine | Admitting: Emergency Medicine

## 2023-02-12 DIAGNOSIS — I10 Essential (primary) hypertension: Secondary | ICD-10-CM | POA: Diagnosis not present

## 2023-02-12 NOTE — Discharge Instructions (Addendum)
Blood pressure in our office is 154/109, while elevated I believe you are stable at this time  Your EKG shows that your heart is in atrial fibrillation which is your known rhythm, heart rate is stable in the 70s  You currently have no signs of your elevated blood pressure causing stress to your body organs  Please keep your upcoming appointment with your cardiologist next week so they may reevaluate and make adjustments to your medicines as needed  At any point if you begin to experience chest pain, shortness of breath, severe abdominal pain, dizziness, lightheadedness or visual disturbances and your blood pressure is elevated please go to the nearest emergency department for immediate evaluation and workup

## 2023-02-12 NOTE — ED Triage Notes (Signed)
Pt c/o elevated blood pressure  Pt has been taking his BP at home to prepare for a [facility] appointment  Pt most recent readings were 160/104, 152/106, 155/110  Pt has a heart doctor but they are closed.  Pt stopped taking lisinopril November 2023 and had Afib December 2022.   Pt denies pain or any chest pressure.

## 2023-02-12 NOTE — ED Provider Notes (Signed)
[facility]    CSN: [id number] Arrival date & time: 02/12/23  1040      History   Chief Complaint Chief Complaint  Patient presents with   Hypertension    HPI [name] is a 77 y.o. male.   Patient presents for evaluation of elevated blood pressure readings at home.  Begin checking blood pressure for upcoming appointment [facility].  Has been checking twice daily at 6:30 AM and 10 PM, systolic ranging from [id number] to [id number], diastolically ranging from 100s to 110.  Has been taking daily metoprolol as prescribed without missing dosages.  Denies dizziness, lightheadedness, chest pain, shortness of breath, visual disturbance or abdominal pain.  History of atrial fibrillation, followed by cardiology.  Endorses the only change over the last month is intermittent left shoulder pain that occurred after reaching to help catch someone from falling.  Managing with over-the-counter medications as needed.      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   Squamous cell carcinoma of skin 07/09/2014   Anterior neck, In situ   Squamous cell carcinoma of skin 09/24/2021   L temple - SCCIS    Patient Active Problem List   Diagnosis Date Noted   Pneumonia due to COVID-19 virus 10/23/2020   Rapid atrial fibrillation ([place]) 10/23/2020   Pneumonia 02/25/2016   Hypoxia 02/25/2016   Hyperlipidemia, mixed 09/07/2014   Hypertension 05/26/2014   Gout, joint 05/26/2014   Arthritis 05/26/2014    Past Surgical History:  Procedure Laterality Date   CARDIOVERSION N/A 12/03/2020   Procedure: CARDIOVERSION;  Surgeon: Isaias Cowman, MD;  Location: [facility];  Service: Cardiovascular;  Laterality: N/A;   CATARACT EXTRACTION      COLONOSCOPY WITH ESOPHAGOGASTRODUODENOSCOPY (EGD)     COLONOSCOPY WITH PROPOFOL N/A 01/20/2017   Procedure: COLONOSCOPY WITH PROPOFOL;  Surgeon: Manya Silvas, MD;  Location: [facility];  Service: Endoscopy;  Laterality: N/A;   REPLACEMENT TOTAL KNEE Right        Home Medications    Prior to Admission medications   Medication Sig Start Date End Date Taking? Authorizing Provider  acetaminophen (TYLENOL) 500 MG tablet Take 1,000 mg by mouth every 8 (eight) hours as needed for moderate pain.   Yes [provider]  allopurinol (ZYLOPRIM) 100 MG tablet Take 100 mg by mouth daily. 08/14/22 08/14/23 Yes [provider]  amoxicillin-clavulanate (AUGMENTIN) 875-125 MG tablet Take 1 tablet by mouth every 12 (twelve) hours. 02/24/22  Yes Hazel Sams, PA-C  lisinopril (ZESTRIL) 20 MG tablet Hold until followup with outpatient doctor because your blood pressure has been low normal without it. 10/27/20  Yes Enzo Bi, MD  lovastatin (MEVACOR) 40 MG tablet Take 40 mg by mouth every evening. 02/18/16  Yes [provider]  Multiple Vitamin (MULTIVITAMIN WITH MINERALS) TABS tablet Take 1 tablet by mouth daily.   Yes [provider]  omeprazole (PRILOSEC) 20 MG capsule Take by mouth. 10/21/21  Yes [provider]  zinc sulfate 220 (50 Zn) MG capsule Take 220 mg by mouth daily.   Yes [provider]  apixaban (ELIQUIS) 5 MG TABS tablet Take 1 tablet (5 mg total) by mouth 2 (two) times daily. New  blood thinner ordered by cardiology. 10/27/20 01/25/21  Enzo Bi, MD  guaiFENesin-dextromethorphan (ROBITUSSIN DM) 100-10 MG/5ML syrup Take 10 mLs by mouth every 4 (four) hours as needed for cough. Patient not taking: Reported on 05/01/2021 10/27/20   Enzo Bi, MD  metoprolol tartrate (LOPRESSOR) 50 MG tablet Take 1 tablet (50 mg total) by mouth 2 (two) times daily. To control your heart rate. 10/27/20 01/25/21  Enzo Bi, MD    Family History Family History   Problem Relation Age of Onset   Cancer Mother    Liver cancer Father    Diabetes Father     Social History Social History   Tobacco Use   Smoking status: Never   Smokeless tobacco: Never  Vaping Use   Vaping Use: Never used  Substance Use Topics   Alcohol use: No   Drug use: No     Allergies   Lipitor [atorvastatin], Sulfa antibiotics, and Zocor [simvastatin]   Review of Systems Review of Systems   Physical Exam Triage Vital Signs ED Triage Vitals  Enc Vitals Group     BP 02/12/23 1110 (!) 154/109     Pulse Rate 02/12/23 1110 74     Resp --      Temp 02/12/23 1110 97.8 F (36.6 C)     Temp Source 02/12/23 1110 Oral     SpO2 02/12/23 1110 100 %     Weight 02/12/23 1108 220 lb (99.8 kg)     Height 02/12/23 1108 5\' 10"  (1.778 m)     Head Circumference --      Peak Flow --      Pain Score 02/12/23 1108 0     Pain Loc --      Pain Edu? --      Excl. in [place]? --    No data found.  Updated Vital Signs BP (!) 154/109 (BP Location: Left Arm)   Pulse 74   Temp 97.8 F (36.6 C) (Oral)   Ht 5\' 10"  (1.778 m)   Wt 220 lb (99.8 kg)   SpO2 100%   BMI 31.57 kg/m   Visual Acuity Right Eye Distance:   Left Eye Distance:   Bilateral Distance:    Right Eye Near:   Left Eye Near:    Bilateral Near:     Physical Exam Constitutional:      Appearance: Normal appearance.  Eyes:     Extraocular Movements: Extraocular movements intact.  Cardiovascular:     Rate and Rhythm: Normal rate. Rhythm irregular.     Pulses: Normal pulses.     Heart sounds: Normal heart sounds.  Pulmonary:     Effort: Pulmonary effort is normal.     Breath sounds: Normal breath sounds.  Neurological:     Mental Status: He is alert and oriented to person, place, and time. Mental status is at baseline.      UC Treatments / Results  Labs (all labs ordered are listed, but only abnormal results are displayed) Labs Reviewed - No data to display  EKG   Radiology No results  found.  Procedures Procedures (including critical care time)  Medications Ordered in UC Medications - No data to display  Initial Impression / Assessment and Plan / UC Course  I have reviewed the triage vital signs and the nursing notes.  Pertinent labs & imaging results that were available during my care of the patient were reviewed by me and considered in my medical decision making (see chart for details).  Elevated  blood pressure reading in office with diagnosis of hypertension  Blood pressure 154/109, no signs of hypertensive urgency, EKG showing A-fib with heart rate at 79, patient is in no signs of distress, stable for outpatient management, will not make changes to medications at this time as he has upcoming appointment with his cardiologist next week, did advise to notify cardiologist on Monday morning, given strict precautions for any signs of hypertensive urgency with elevated blood pressure that he is to go to the nearest emergency department for immediate evaluation, verbalized understanding Final Clinical Impressions(s) / UC Diagnoses   Final diagnoses:  None   Discharge Instructions   None    ED Prescriptions   None    PDMP not reviewed this encounter.   Hans Eden, NP 02/12/23 1214

## 2023-02-19 DIAGNOSIS — I4819 Other persistent atrial fibrillation: Secondary | ICD-10-CM | POA: Diagnosis not present

## 2023-02-19 DIAGNOSIS — M109 Gout, unspecified: Secondary | ICD-10-CM | POA: Diagnosis not present

## 2023-02-19 DIAGNOSIS — R0902 Hypoxemia: Secondary | ICD-10-CM | POA: Diagnosis not present

## 2023-02-19 DIAGNOSIS — Z125 Encounter for screening for malignant neoplasm of prostate: Secondary | ICD-10-CM | POA: Diagnosis not present

## 2023-02-19 DIAGNOSIS — G4733 Obstructive sleep apnea (adult) (pediatric): Secondary | ICD-10-CM | POA: Diagnosis not present

## 2023-02-19 DIAGNOSIS — K219 Gastro-esophageal reflux disease without esophagitis: Secondary | ICD-10-CM | POA: Diagnosis not present

## 2023-02-19 DIAGNOSIS — Z1331 Encounter for screening for depression: Secondary | ICD-10-CM | POA: Diagnosis not present

## 2023-02-19 DIAGNOSIS — Z0000 Encounter for general adult medical examination without abnormal findings: Secondary | ICD-10-CM | POA: Diagnosis not present

## 2023-02-19 DIAGNOSIS — E782 Mixed hyperlipidemia: Secondary | ICD-10-CM | POA: Diagnosis not present

## 2023-02-19 DIAGNOSIS — I10 Essential (primary) hypertension: Secondary | ICD-10-CM | POA: Diagnosis not present

## 2023-03-05 DIAGNOSIS — K219 Gastro-esophageal reflux disease without esophagitis: Secondary | ICD-10-CM | POA: Diagnosis not present

## 2023-03-05 DIAGNOSIS — R7989 Other specified abnormal findings of blood chemistry: Secondary | ICD-10-CM | POA: Diagnosis not present

## 2023-03-05 DIAGNOSIS — M109 Gout, unspecified: Secondary | ICD-10-CM | POA: Diagnosis not present

## 2023-03-05 DIAGNOSIS — E782 Mixed hyperlipidemia: Secondary | ICD-10-CM | POA: Diagnosis not present

## 2023-03-05 DIAGNOSIS — I10 Essential (primary) hypertension: Secondary | ICD-10-CM | POA: Diagnosis not present

## 2023-03-05 DIAGNOSIS — Z125 Encounter for screening for malignant neoplasm of prostate: Secondary | ICD-10-CM | POA: Diagnosis not present

## 2023-04-08 DIAGNOSIS — Z961 Presence of intraocular lens: Secondary | ICD-10-CM | POA: Diagnosis not present

## 2023-06-30 DIAGNOSIS — G8929 Other chronic pain: Secondary | ICD-10-CM | POA: Diagnosis not present

## 2023-06-30 DIAGNOSIS — Z96651 Presence of right artificial knee joint: Secondary | ICD-10-CM | POA: Diagnosis not present

## 2023-06-30 DIAGNOSIS — M25561 Pain in right knee: Secondary | ICD-10-CM | POA: Diagnosis not present

## 2023-07-21 DIAGNOSIS — M25569 Pain in unspecified knee: Secondary | ICD-10-CM | POA: Diagnosis not present

## 2023-07-21 DIAGNOSIS — M25559 Pain in unspecified hip: Secondary | ICD-10-CM | POA: Diagnosis not present

## 2023-07-26 DIAGNOSIS — M25569 Pain in unspecified knee: Secondary | ICD-10-CM | POA: Diagnosis not present

## 2023-07-26 DIAGNOSIS — M25559 Pain in unspecified hip: Secondary | ICD-10-CM | POA: Diagnosis not present

## 2023-08-04 DIAGNOSIS — M25559 Pain in unspecified hip: Secondary | ICD-10-CM | POA: Diagnosis not present

## 2023-08-04 DIAGNOSIS — M25569 Pain in unspecified knee: Secondary | ICD-10-CM | POA: Diagnosis not present

## 2023-08-11 DIAGNOSIS — M25569 Pain in unspecified knee: Secondary | ICD-10-CM | POA: Diagnosis not present

## 2023-08-11 DIAGNOSIS — M25559 Pain in unspecified hip: Secondary | ICD-10-CM | POA: Diagnosis not present

## 2023-08-18 DIAGNOSIS — M25569 Pain in unspecified knee: Secondary | ICD-10-CM | POA: Diagnosis not present

## 2023-08-18 DIAGNOSIS — M25559 Pain in unspecified hip: Secondary | ICD-10-CM | POA: Diagnosis not present

## 2023-08-25 DIAGNOSIS — M25559 Pain in unspecified hip: Secondary | ICD-10-CM | POA: Diagnosis not present

## 2023-08-25 DIAGNOSIS — M25569 Pain in unspecified knee: Secondary | ICD-10-CM | POA: Diagnosis not present

## 2023-08-27 DIAGNOSIS — I4819 Other persistent atrial fibrillation: Secondary | ICD-10-CM | POA: Diagnosis not present

## 2023-08-27 DIAGNOSIS — I10 Essential (primary) hypertension: Secondary | ICD-10-CM | POA: Diagnosis not present

## 2023-08-27 DIAGNOSIS — M109 Gout, unspecified: Secondary | ICD-10-CM | POA: Diagnosis not present

## 2023-08-27 DIAGNOSIS — R972 Elevated prostate specific antigen [PSA]: Secondary | ICD-10-CM | POA: Diagnosis not present

## 2023-08-27 DIAGNOSIS — E782 Mixed hyperlipidemia: Secondary | ICD-10-CM | POA: Diagnosis not present

## 2023-08-27 DIAGNOSIS — K219 Gastro-esophageal reflux disease without esophagitis: Secondary | ICD-10-CM | POA: Diagnosis not present

## 2023-09-01 DIAGNOSIS — M25559 Pain in unspecified hip: Secondary | ICD-10-CM | POA: Diagnosis not present

## 2023-09-01 DIAGNOSIS — M25569 Pain in unspecified knee: Secondary | ICD-10-CM | POA: Diagnosis not present

## 2023-09-08 DIAGNOSIS — M25569 Pain in unspecified knee: Secondary | ICD-10-CM | POA: Diagnosis not present

## 2023-09-08 DIAGNOSIS — M25559 Pain in unspecified hip: Secondary | ICD-10-CM | POA: Diagnosis not present

## 2023-09-15 DIAGNOSIS — E782 Mixed hyperlipidemia: Secondary | ICD-10-CM | POA: Diagnosis not present

## 2023-09-15 DIAGNOSIS — R0902 Hypoxemia: Secondary | ICD-10-CM | POA: Diagnosis not present

## 2023-09-15 DIAGNOSIS — I10 Essential (primary) hypertension: Secondary | ICD-10-CM | POA: Diagnosis not present

## 2023-09-15 DIAGNOSIS — K219 Gastro-esophageal reflux disease without esophagitis: Secondary | ICD-10-CM | POA: Diagnosis not present

## 2023-09-15 DIAGNOSIS — M109 Gout, unspecified: Secondary | ICD-10-CM | POA: Diagnosis not present

## 2023-09-15 DIAGNOSIS — G4733 Obstructive sleep apnea (adult) (pediatric): Secondary | ICD-10-CM | POA: Diagnosis not present

## 2023-09-15 DIAGNOSIS — I4819 Other persistent atrial fibrillation: Secondary | ICD-10-CM | POA: Diagnosis not present

## 2023-09-22 ENCOUNTER — Other Ambulatory Visit: Payer: Self-pay | Admitting: Internal Medicine

## 2023-09-22 DIAGNOSIS — M25569 Pain in unspecified knee: Secondary | ICD-10-CM | POA: Diagnosis not present

## 2023-09-22 DIAGNOSIS — M25559 Pain in unspecified hip: Secondary | ICD-10-CM | POA: Diagnosis not present

## 2023-09-22 DIAGNOSIS — E782 Mixed hyperlipidemia: Secondary | ICD-10-CM

## 2023-09-27 IMAGING — DX DG CHEST 2V
2 series · 2 of 2 positions shown · non-contrast
Comparison: 10/24/2019

CLINICAL DATA: Wheezing and productive cough for several days

EXAM:
CHEST - 2 VIEW

[chest pa]
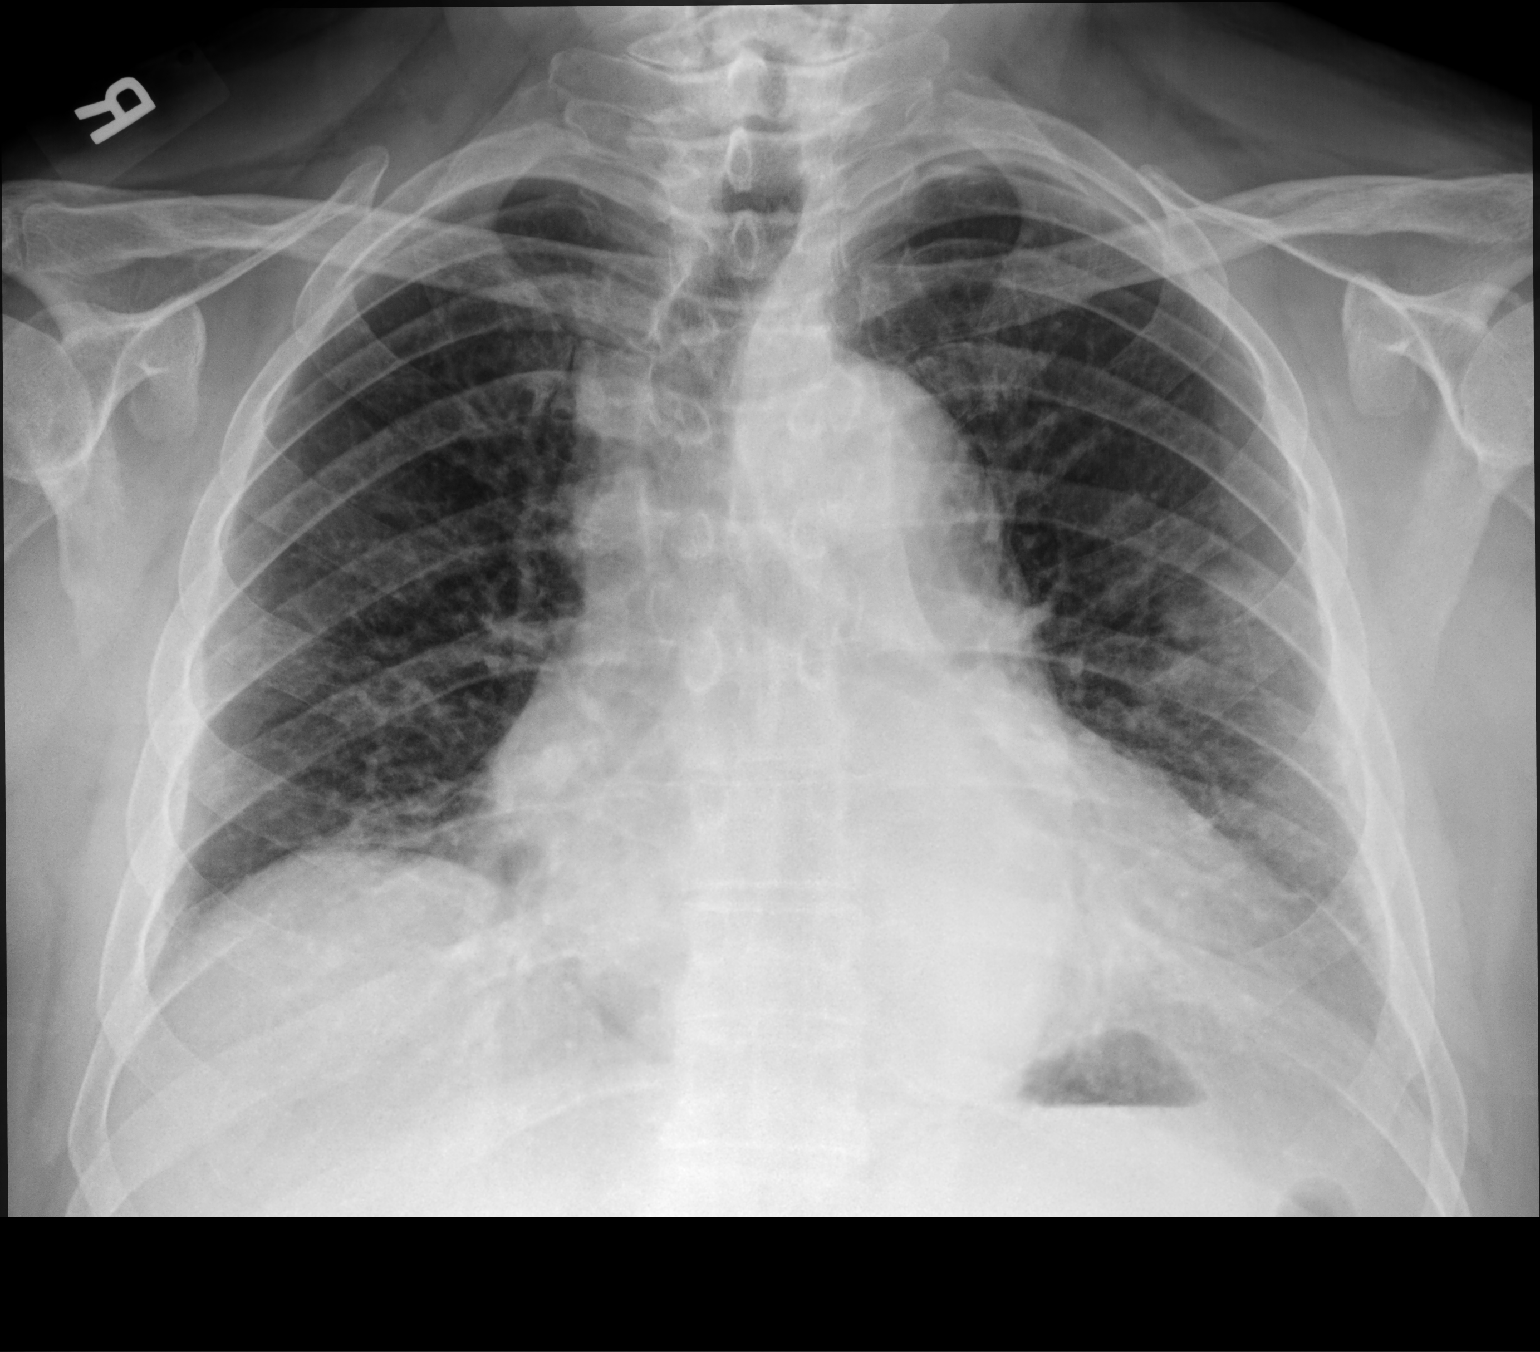

[chest lat]
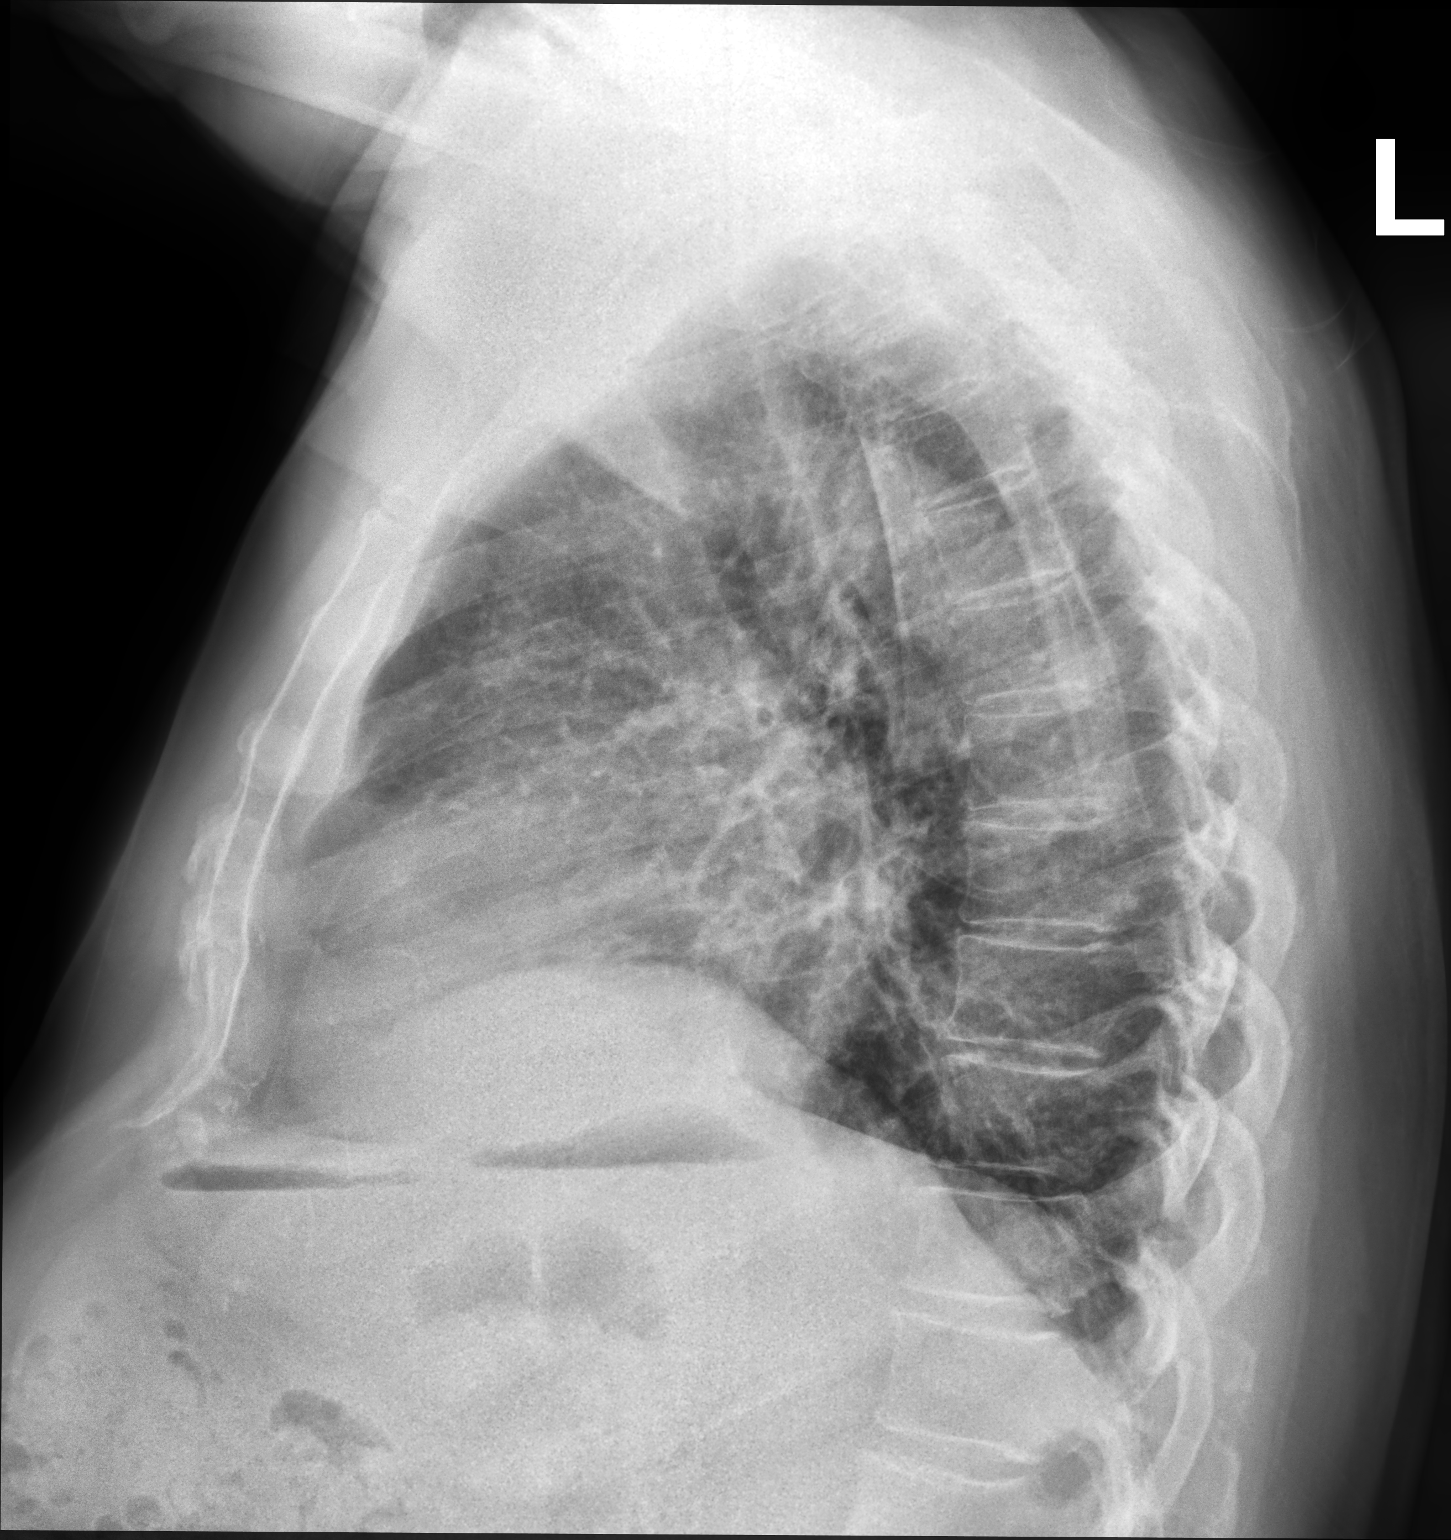

[2 of 2 positions shown; findings below may reference images not displayed]

FINDINGS: Cardiac shadow is enlarged but stable. The lungs are well aerated
bilaterally. No focal infiltrate or effusion is seen. Nodular
densities are noted in the midportion of the left lung measuring up
to 14 mm. No bony abnormality is noted.
IMPRESSION: No focal infiltrate is noted.

Some nodularity is noted in the midportion of the left lung. CT of
the chest with contrast is recommended for further evaluation.

## 2023-10-06 ENCOUNTER — Ambulatory Visit: Payer: PPO | Admitting: Dermatology

## 2023-10-06 DIAGNOSIS — W908XXA Exposure to other nonionizing radiation, initial encounter: Secondary | ICD-10-CM | POA: Diagnosis not present

## 2023-10-06 DIAGNOSIS — L814 Other melanin hyperpigmentation: Secondary | ICD-10-CM | POA: Diagnosis not present

## 2023-10-06 DIAGNOSIS — D1801 Hemangioma of skin and subcutaneous tissue: Secondary | ICD-10-CM

## 2023-10-06 DIAGNOSIS — I8393 Asymptomatic varicose veins of bilateral lower extremities: Secondary | ICD-10-CM

## 2023-10-06 DIAGNOSIS — D229 Melanocytic nevi, unspecified: Secondary | ICD-10-CM

## 2023-10-06 DIAGNOSIS — Z86007 Personal history of in-situ neoplasm of skin: Secondary | ICD-10-CM | POA: Diagnosis not present

## 2023-10-06 DIAGNOSIS — Z8589 Personal history of malignant neoplasm of other organs and systems: Secondary | ICD-10-CM

## 2023-10-06 DIAGNOSIS — Z1283 Encounter for screening for malignant neoplasm of skin: Secondary | ICD-10-CM | POA: Diagnosis not present

## 2023-10-06 DIAGNOSIS — L578 Other skin changes due to chronic exposure to nonionizing radiation: Secondary | ICD-10-CM

## 2023-10-06 DIAGNOSIS — L820 Inflamed seborrheic keratosis: Secondary | ICD-10-CM

## 2023-10-06 DIAGNOSIS — L821 Other seborrheic keratosis: Secondary | ICD-10-CM

## 2023-10-06 DIAGNOSIS — L905 Scar conditions and fibrosis of skin: Secondary | ICD-10-CM

## 2023-10-06 DIAGNOSIS — I781 Nevus, non-neoplastic: Secondary | ICD-10-CM

## 2023-10-06 DIAGNOSIS — L570 Actinic keratosis: Secondary | ICD-10-CM

## 2023-10-06 NOTE — Progress Notes (Signed)
Follow-Up Visit   Subjective  [name] is a 77 y.o. male who presents for the following: Skin Cancer Screening and Full Body Skin Exam  The patient presents for Total-Body Skin Exam (TBSE) for skin cancer screening and mole check. The patient has spots, moles and lesions to be evaluated, some may be new or changing and the patient may have concern these could be cancer.    The following portions of the chart were reviewed this encounter and updated as appropriate: medications, allergies, medical history  Review of Systems:  No other skin or systemic complaints except as noted in HPI or Assessment and Plan.  Objective  Well appearing patient in no apparent distress; mood and affect are within normal limits.  A full examination was performed including scalp, head, eyes, ears, nose, lips, neck, chest, axillae, abdomen, back, buttocks, bilateral upper extremities, bilateral lower extremities, hands, feet, fingers, toes, fingernails, and toenails. All findings within normal limits unless otherwise noted below.   Relevant physical exam findings are noted in the Assessment and Plan.  L ear x 1, L temple x 1 (2) Erythematous thin papules/macules with gritty scale.   L scalp x 1, R cheek x 1 (4) Erythematous keratotic or waxy stuck-on papule or plaque.     Assessment & Plan   SKIN CANCER SCREENING PERFORMED TODAY.  ACTINIC DAMAGE - Chronic condition, secondary to cumulative UV/sun exposure - diffuse scaly erythematous macules with underlying dyspigmentation - Recommend daily broad spectrum sunscreen SPF 30+ to sun-exposed areas, reapply every 2 hours as needed.  - Staying in the shade or wearing long sleeves, sun glasses (UVA+UVB protection) and wide brim hats (4-inch brim around the entire circumference of the hat) are also recommended for sun protection.  - Call for new or changing lesions.  LENTIGINES, SEBORRHEIC KERATOSES, HEMANGIOMAS - Benign normal skin lesions -  Benign-appearing - Call for any changes  MELANOCYTIC NEVI - Tan-brown and/or pink-flesh-colored symmetric macules and papules - Benign appearing on exam today - Observation - Call clinic for new or changing moles - Recommend daily use of broad spectrum spf 30+ sunscreen to sun-exposed areas.   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  - Call if any new or changing lesions are noted between office visits  SCAR - L nose, bx from another dermatologist, not cancerous per patient Exam: Dyspigmented smooth macule or patch. Benign-appearing.  Observation.  Call clinic for new or changing lesions. Recommend daily broad spectrum sunscreen SPF 30+, reapply every 2 hours as needed. Treatment: Recommend Serica moisturizing scar formula cream every night or [facility] brand or Mederma silicone scar sheet every night for the first year after a scar appears to help with scar remodeling if desired. Scars remodel on their own for a full year and will gradually improve in appearance over time.   AK (actinic keratosis) (2) L ear x 1, L temple x 1  Actinic keratoses are precancerous spots that appear secondary to cumulative UV radiation exposure/sun exposure over time. They are chronic with expected duration over 1 year. A portion of actinic keratoses will progress to squamous cell carcinoma of the skin. It is not possible to reliably predict which spots will progress to skin cancer and so treatment is recommended to prevent development of skin cancer.  Recommend  daily broad spectrum sunscreen SPF 30+ to sun-exposed areas, reapply every 2 hours as needed.  Recommend staying in the shade or wearing long sleeves, sun glasses (UVA+UVB protection) and wide brim hats (4-inch brim around the entire  circumference of the hat). Call for new or changing lesions.   Destruction of lesion - L ear x 1, L temple x 1 (2) Complexity: simple   Destruction method: cryotherapy   Informed consent: discussed and consent obtained   Timeout:  patient name, date of birth, surgical site, and procedure verified Lesion destroyed using liquid nitrogen: Yes   Region frozen until ice ball extended beyond lesion: Yes   Outcome: patient tolerated procedure well with no complications   Post-procedure details: wound care instructions given    Inflamed seborrheic keratosis (4) L scalp x 1, R cheek x 1  Reassured benign age-related growth.  Recommend observation.  Discussed cryotherapy if spot(s) become irritated or inflamed.   Prior to procedure, discussed risks of blister formation, small wound, skin dyspigmentation, or rare scar following cryotherapy. Recommend Vaseline ointment to treated areas while healing.   Destruction of lesion - L scalp x 1, R cheek x 1 (4) Complexity: simple   Destruction method: cryotherapy   Informed consent: discussed and consent obtained   Timeout:  patient name, date of birth, surgical site, and procedure verified Lesion destroyed using liquid nitrogen: Yes   Region frozen until ice ball extended beyond lesion: Yes   Outcome: patient tolerated procedure well with no complications   Post-procedure details: wound care instructions given    Varicose Veins/Spider Veins - Dilated blue, purple or red veins at the lower extremities - Reassured - Smaller vessels can be treated by sclerotherapy (a procedure to inject a medicine into the veins to make them disappear) if desired, but the treatment is not covered by insurance. Larger vessels may be covered if symptomatic and we would refer to vascular surgeon if treatment desired.  Return in about 1 year (around 10/05/2024) for TBSE.  Maylene Roes, CMA, am acting as scribe for Armida Sans, MD .   Documentation: I have  reviewed the above documentation for accuracy and completeness, and I agree with the above.  Armida Sans, MD

## 2023-10-06 NOTE — Patient Instructions (Signed)

## 2023-10-08 ENCOUNTER — Ambulatory Visit
Admission: RE | Admit: 2023-10-08 | Discharge: 2023-10-08 | Disposition: A | Discharge status: Home / Self Care | Payer: Self-pay | Source: Ambulatory Visit | Attending: Internal Medicine | Admitting: Internal Medicine

## 2023-10-08 DIAGNOSIS — M25569 Pain in unspecified knee: Secondary | ICD-10-CM | POA: Diagnosis not present

## 2023-10-08 DIAGNOSIS — E782 Mixed hyperlipidemia: Secondary | ICD-10-CM | POA: Insufficient documentation

## 2023-10-08 DIAGNOSIS — M25559 Pain in unspecified hip: Secondary | ICD-10-CM | POA: Diagnosis not present

## 2023-10-10 ENCOUNTER — Encounter: Payer: Self-pay | Admitting: Dermatology

## 2023-10-26 IMAGING — CT CT CHEST W/ CM
2 of 4 series · 15 of 36 positions shown, 18 images · IV contrast (agent unspecified)
Comparison: Chest x-ray 02/24/2022, CT 02/25/2016

CLINICAL DATA: Follow-up abnormal chest x-ray

EXAM:
CT CHEST WITH CONTRAST
TECHNIQUE: Multidetector CT imaging of the chest was performed during
intravenous contrast administration.

[Series 2: axial st · axial · 0.69mm/px · z∈[-141,+115]mm · 12 of 152 slices shown, 15 images]
[im 12/152  mediastinal]
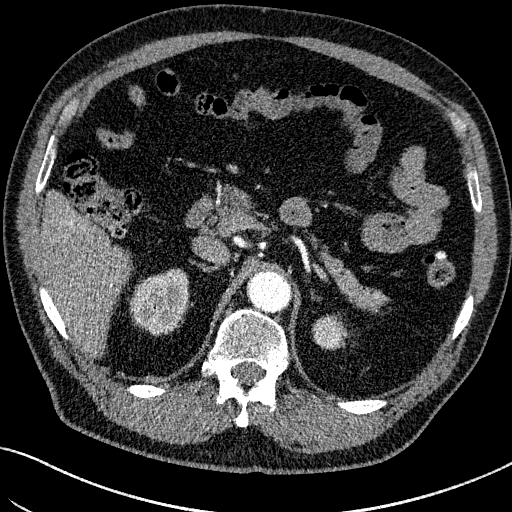
[im 12/152  lung]
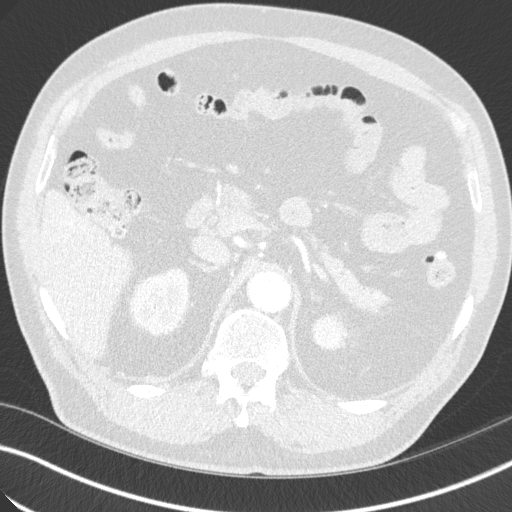
[im 24/152  lung]
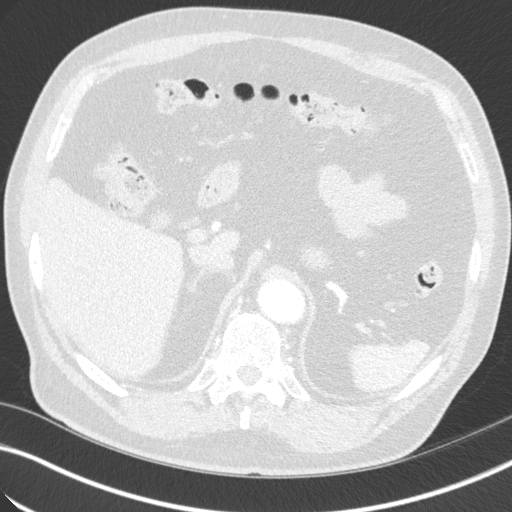
[im 35/152  lung]
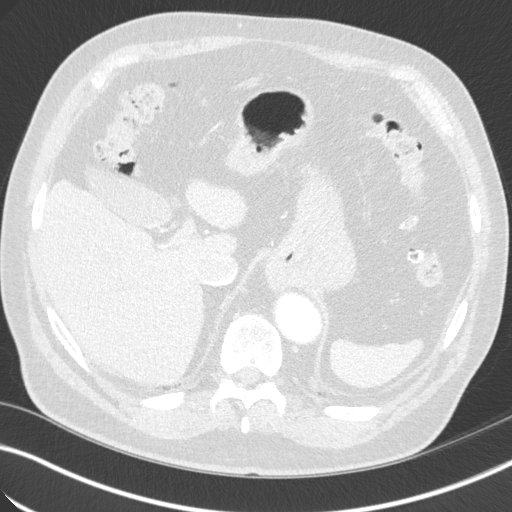
[im 47/152  lung]
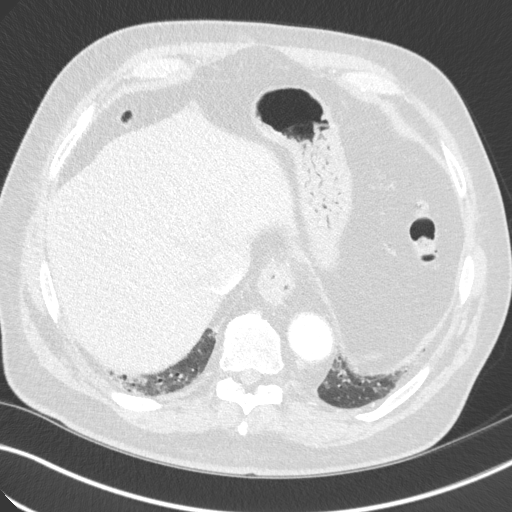
[im 59/152  mediastinal]
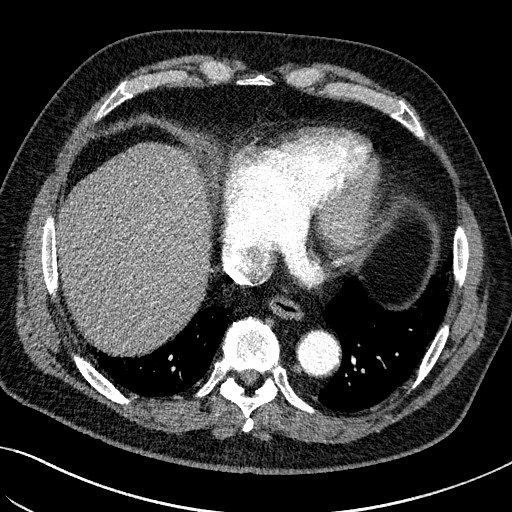
[im 59/152  lung]
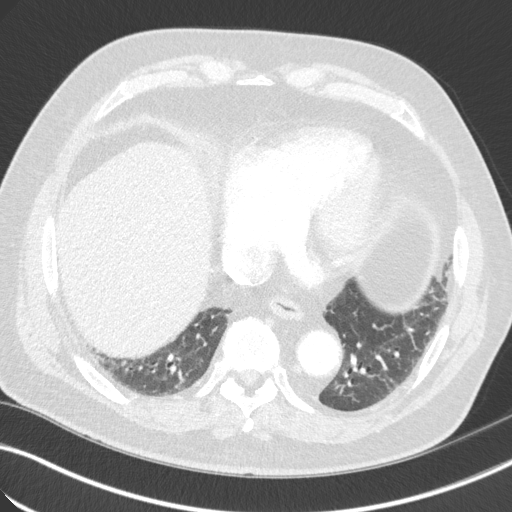
[im 70/152  lung]
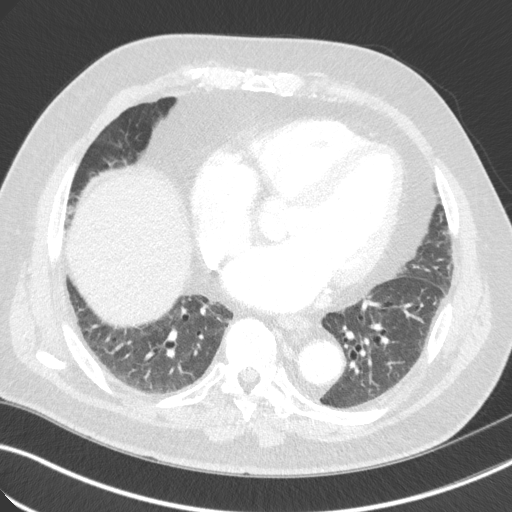
[im 82/152  lung]
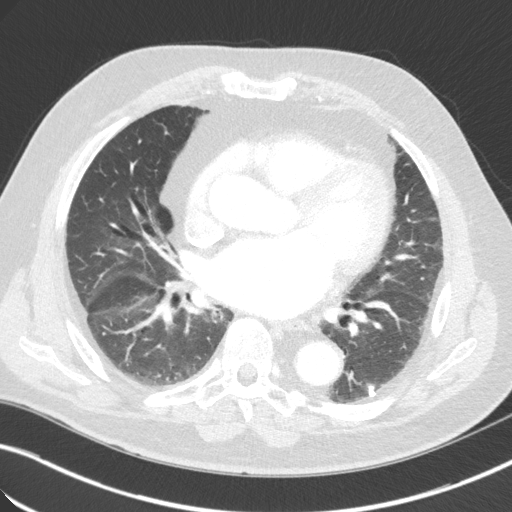
[im 93/152  lung]
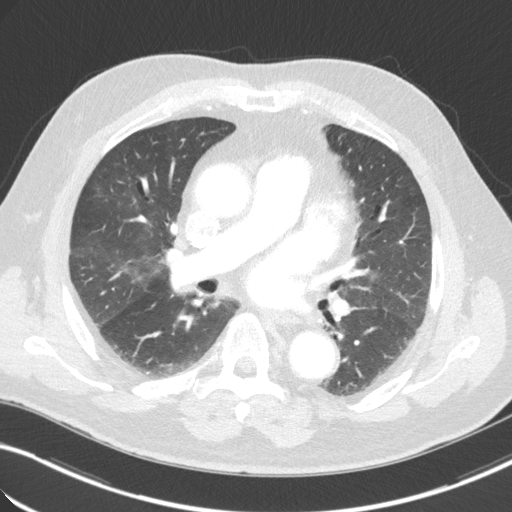
[im 105/152  mediastinal]
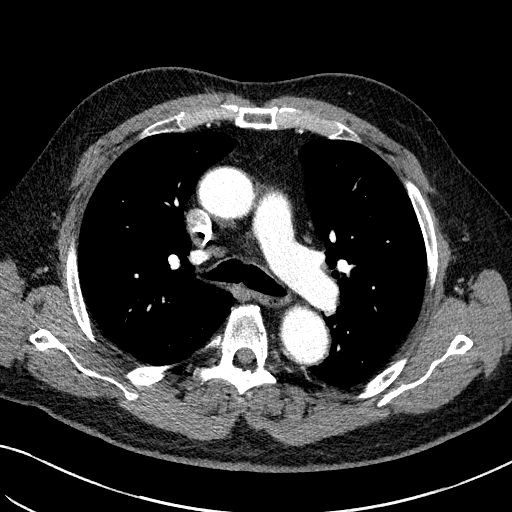
[im 105/152  lung]
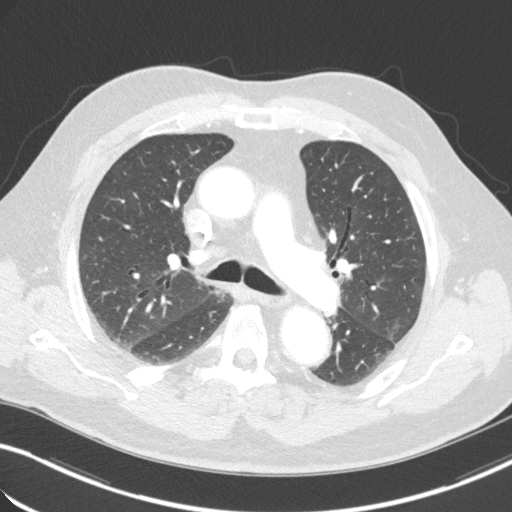
[im 117/152  lung]
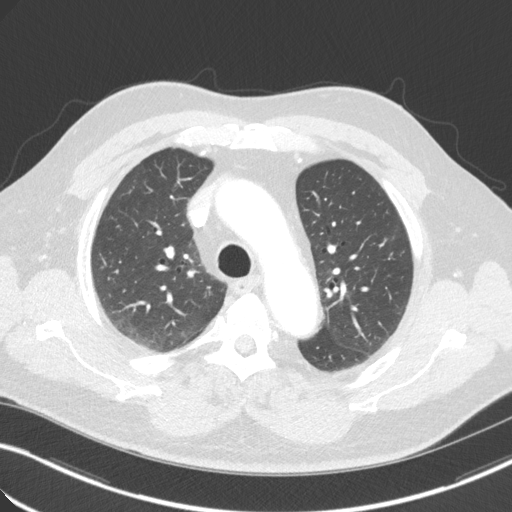
[im 128/152  lung]
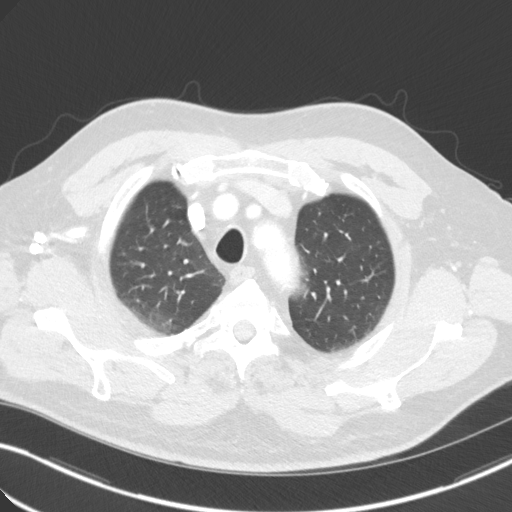
[im 140/152  lung]
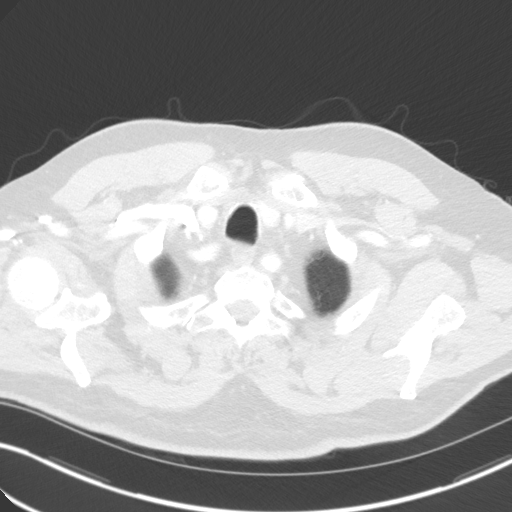

[Series 5: coronal · coronal · 0.67mm/px · 3 of 165 slices shown]
[im 33/165  lung]
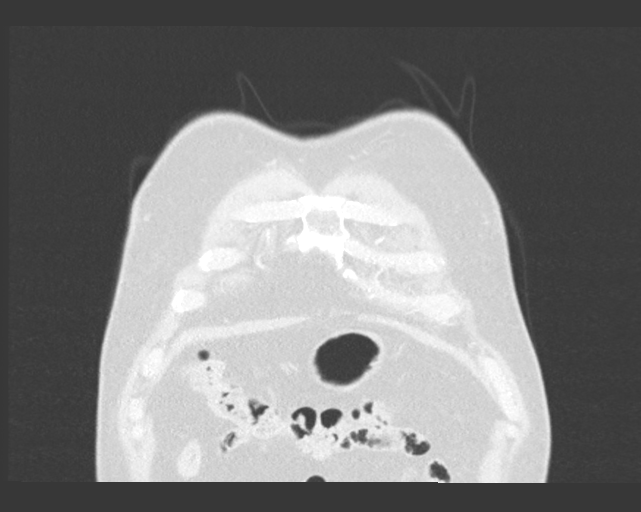
[im 66/165  lung]
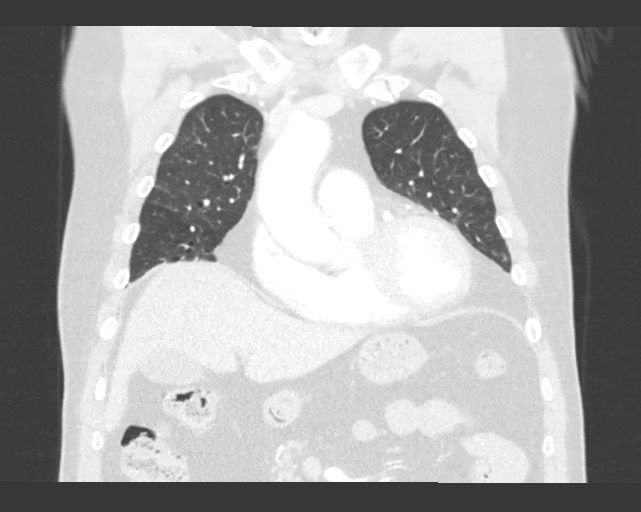
[im 99/165  lung]
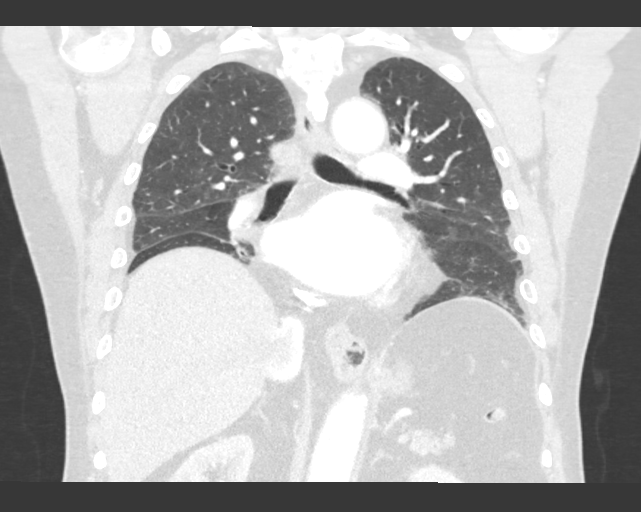

[15 of 36 positions shown; findings below may reference images not displayed]

RADIATION DOSE REDUCTION: This exam was performed according to the
departmental dose-optimization program which includes automated
exposure control, adjustment of the mA and/or kV according to
patient size and/or use of iterative reconstruction technique.

CONTRAST:  75mL OMNIPAQUE IOHEXOL 300 MG/ML  SOLN
FINDINGS: Cardiovascular: Nonaneurysmal aorta. Cardiomegaly. No pericardial
effusion

Mediastinum/Nodes: Midline trachea. No thyroid mass. No suspicious
lymph nodes. Esophagus within normal limits

Lungs/Pleura: Calcified granuloma in the left lower lobe. No acute
consolidation, pleural effusion, or pneumothorax. No suspicious lung
nodules. Punctate calcified granuloma right middle lobe. Minimal
subpleural reticulation could be due to post infectious scarring.

Upper Abdomen: Hypervascular lesion in the inferior right hepatic
lobe measuring 13 mm, series 2, image 144, stable and felt
consistent with hemangioma. No follow-up imaging recommended.
Probable small gallstones.

Musculoskeletal: No chest wall abnormality. No acute or significant
osseous findings.
IMPRESSION: 1. No suspicious pulmonary nodules are visualized. There is no acute
airspace disease.
2. Evidence of prior granulomatous disease.
3. Cardiomegaly

## 2023-12-02 DIAGNOSIS — Z96651 Presence of right artificial knee joint: Secondary | ICD-10-CM | POA: Diagnosis not present

## 2023-12-20 DIAGNOSIS — J101 Influenza due to other identified influenza virus with other respiratory manifestations: Secondary | ICD-10-CM | POA: Diagnosis not present

## 2023-12-20 DIAGNOSIS — Z03818 Encounter for observation for suspected exposure to other biological agents ruled out: Secondary | ICD-10-CM | POA: Diagnosis not present

## 2024-03-01 DIAGNOSIS — Z0000 Encounter for general adult medical examination without abnormal findings: Secondary | ICD-10-CM | POA: Diagnosis not present

## 2024-03-01 DIAGNOSIS — I4819 Other persistent atrial fibrillation: Secondary | ICD-10-CM | POA: Diagnosis not present

## 2024-03-01 DIAGNOSIS — K219 Gastro-esophageal reflux disease without esophagitis: Secondary | ICD-10-CM | POA: Diagnosis not present

## 2024-03-01 DIAGNOSIS — I10 Essential (primary) hypertension: Secondary | ICD-10-CM | POA: Diagnosis not present

## 2024-03-01 DIAGNOSIS — M109 Gout, unspecified: Secondary | ICD-10-CM | POA: Diagnosis not present

## 2024-03-01 DIAGNOSIS — E782 Mixed hyperlipidemia: Secondary | ICD-10-CM | POA: Diagnosis not present

## 2024-03-10 DIAGNOSIS — E782 Mixed hyperlipidemia: Secondary | ICD-10-CM | POA: Diagnosis not present

## 2024-03-16 DIAGNOSIS — R051 Acute cough: Secondary | ICD-10-CM | POA: Diagnosis not present

## 2024-03-16 DIAGNOSIS — J22 Unspecified acute lower respiratory infection: Secondary | ICD-10-CM | POA: Diagnosis not present

## 2024-03-16 DIAGNOSIS — Z03818 Encounter for observation for suspected exposure to other biological agents ruled out: Secondary | ICD-10-CM | POA: Diagnosis not present

## 2024-03-31 DIAGNOSIS — R9389 Abnormal findings on diagnostic imaging of other specified body structures: Secondary | ICD-10-CM | POA: Diagnosis not present

## 2024-04-18 DIAGNOSIS — R600 Localized edema: Secondary | ICD-10-CM | POA: Diagnosis not present

## 2024-04-18 DIAGNOSIS — R053 Chronic cough: Secondary | ICD-10-CM | POA: Diagnosis not present

## 2024-04-18 DIAGNOSIS — J90 Pleural effusion, not elsewhere classified: Secondary | ICD-10-CM | POA: Diagnosis not present

## 2024-04-18 DIAGNOSIS — I4891 Unspecified atrial fibrillation: Secondary | ICD-10-CM | POA: Diagnosis not present

## 2024-05-03 DIAGNOSIS — J90 Pleural effusion, not elsewhere classified: Secondary | ICD-10-CM | POA: Diagnosis not present

## 2024-05-03 DIAGNOSIS — J810 Acute pulmonary edema: Secondary | ICD-10-CM | POA: Diagnosis not present

## 2024-05-03 DIAGNOSIS — R053 Chronic cough: Secondary | ICD-10-CM | POA: Diagnosis not present

## 2024-05-12 DIAGNOSIS — R052 Subacute cough: Secondary | ICD-10-CM | POA: Diagnosis not present

## 2024-05-14 DIAGNOSIS — Z8679 Personal history of other diseases of the circulatory system: Secondary | ICD-10-CM | POA: Diagnosis not present

## 2024-05-14 DIAGNOSIS — B9689 Other specified bacterial agents as the cause of diseases classified elsewhere: Secondary | ICD-10-CM | POA: Diagnosis not present

## 2024-05-14 DIAGNOSIS — J019 Acute sinusitis, unspecified: Secondary | ICD-10-CM | POA: Diagnosis not present

## 2024-07-05 DIAGNOSIS — Z1331 Encounter for screening for depression: Secondary | ICD-10-CM | POA: Diagnosis not present

## 2024-07-05 DIAGNOSIS — Z125 Encounter for screening for malignant neoplasm of prostate: Secondary | ICD-10-CM | POA: Diagnosis not present

## 2024-07-05 DIAGNOSIS — I4891 Unspecified atrial fibrillation: Secondary | ICD-10-CM | POA: Diagnosis not present

## 2024-07-05 DIAGNOSIS — E782 Mixed hyperlipidemia: Secondary | ICD-10-CM | POA: Diagnosis not present

## 2024-07-05 DIAGNOSIS — R799 Abnormal finding of blood chemistry, unspecified: Secondary | ICD-10-CM | POA: Diagnosis not present

## 2024-07-05 DIAGNOSIS — I10 Essential (primary) hypertension: Secondary | ICD-10-CM | POA: Diagnosis not present

## 2024-08-31 DIAGNOSIS — I10 Essential (primary) hypertension: Secondary | ICD-10-CM | POA: Diagnosis not present

## 2024-08-31 DIAGNOSIS — M109 Gout, unspecified: Secondary | ICD-10-CM | POA: Diagnosis not present

## 2024-08-31 DIAGNOSIS — R0902 Hypoxemia: Secondary | ICD-10-CM | POA: Diagnosis not present

## 2024-08-31 DIAGNOSIS — G4733 Obstructive sleep apnea (adult) (pediatric): Secondary | ICD-10-CM | POA: Diagnosis not present

## 2024-08-31 DIAGNOSIS — E782 Mixed hyperlipidemia: Secondary | ICD-10-CM | POA: Diagnosis not present

## 2024-08-31 DIAGNOSIS — R600 Localized edema: Secondary | ICD-10-CM | POA: Diagnosis not present

## 2024-08-31 DIAGNOSIS — I4819 Other persistent atrial fibrillation: Secondary | ICD-10-CM | POA: Diagnosis not present

## 2024-08-31 DIAGNOSIS — K219 Gastro-esophageal reflux disease without esophagitis: Secondary | ICD-10-CM | POA: Diagnosis not present

## 2024-10-04 ENCOUNTER — Ambulatory Visit: Payer: PPO | Admitting: Dermatology

## 2024-10-04 DIAGNOSIS — L905 Scar conditions and fibrosis of skin: Secondary | ICD-10-CM | POA: Diagnosis not present

## 2024-10-04 DIAGNOSIS — Z86007 Personal history of in-situ neoplasm of skin: Secondary | ICD-10-CM

## 2024-10-04 DIAGNOSIS — L814 Other melanin hyperpigmentation: Secondary | ICD-10-CM | POA: Diagnosis not present

## 2024-10-04 DIAGNOSIS — I8390 Asymptomatic varicose veins of unspecified lower extremity: Secondary | ICD-10-CM | POA: Diagnosis not present

## 2024-10-04 DIAGNOSIS — L578 Other skin changes due to chronic exposure to nonionizing radiation: Secondary | ICD-10-CM | POA: Diagnosis not present

## 2024-10-04 DIAGNOSIS — W908XXA Exposure to other nonionizing radiation, initial encounter: Secondary | ICD-10-CM

## 2024-10-04 DIAGNOSIS — Z8589 Personal history of malignant neoplasm of other organs and systems: Secondary | ICD-10-CM

## 2024-10-04 DIAGNOSIS — D1801 Hemangioma of skin and subcutaneous tissue: Secondary | ICD-10-CM | POA: Diagnosis not present

## 2024-10-04 DIAGNOSIS — Z1283 Encounter for screening for malignant neoplasm of skin: Secondary | ICD-10-CM

## 2024-10-04 DIAGNOSIS — L820 Inflamed seborrheic keratosis: Secondary | ICD-10-CM | POA: Diagnosis not present

## 2024-10-04 DIAGNOSIS — L821 Other seborrheic keratosis: Secondary | ICD-10-CM | POA: Diagnosis not present

## 2024-10-04 DIAGNOSIS — D229 Melanocytic nevi, unspecified: Secondary | ICD-10-CM

## 2024-10-04 NOTE — Progress Notes (Signed)
 Follow-Up Visit   Subjective  [name] is a 78 y.o. male who presents for the following: Skin Cancer Screening and Full Body Skin Exam Hx of sccis Hx of Aks and isks  The patient presents for Total-Body Skin Exam (TBSE) for skin cancer screening and mole check. The patient has spots, moles and lesions to be evaluated, some may be new or changing and the patient may have concern these could be cancer.  The following portions of the chart were reviewed this encounter and updated as appropriate: medications, allergies, medical history  Review of Systems:  No other skin or systemic complaints except as noted in HPI or Assessment and Plan.  Objective  Well appearing patient in no apparent distress; mood and affect are within normal limits.  A full examination was performed including scalp, head, eyes, ears, nose, lips, neck, chest, axillae, abdomen, back, buttocks, bilateral upper extremities, bilateral lower extremities, hands, feet, fingers, toes, fingernails, and toenails. All findings within normal limits unless otherwise noted below.   Relevant physical exam findings are noted in the Assessment and Plan.  face x 17 (17) Erythematous stuck-on, waxy papule or plaque  Assessment & Plan   HISTORY OF SQUAMOUS CELL CARCINOMA IN SITU OF THE SKIN 09/24/2021 left temple 07/09/2014 anterior neck  - No evidence of recurrence today - No lymphadenopathy - Recommend regular full body skin exams - Recommend daily broad spectrum sunscreen SPF 30+ to sun-exposed areas, reapply every 2 hours as needed.  - Call if any new or changing lesions are noted between office visits  SCAR - L nose, bx from another dermatologist, not cancerous per patient Exam: Dyspigmented smooth macule or patch. Benign-appearing.  Observation.  Call clinic for new or changing lesions. Recommend daily broad spectrum sunscreen SPF 30+, reapply every 2 hours as needed. Treatment: Recommend Serica moisturizing scar  formula cream every night or [facility] brand or Mederma silicone scar sheet every night for the first year after a scar appears to help with scar remodeling if desired. Scars remodel on their own for a full year and will gradually improve in appearance over time.   SKIN CANCER SCREENING PERFORMED TODAY.  ACTINIC DAMAGE - Chronic condition, secondary to cumulative UV/sun exposure - diffuse scaly erythematous macules with underlying dyspigmentation - Recommend daily broad spectrum sunscreen SPF 30+ to sun-exposed areas, reapply every 2 hours as needed.  - Staying in the shade or wearing long sleeves, sun glasses (UVA+UVB protection) and wide brim hats (4-inch brim around the entire circumference of the hat) are also recommended for sun protection.  - Call for new or changing lesions.  LENTIGINES, SEBORRHEIC KERATOSES, HEMANGIOMAS - Benign normal skin lesions - Benign-appearing - Call for any changes  MELANOCYTIC NEVI - Tan-brown and/or pink-flesh-colored symmetric macules and papules - Benign appearing on exam today - Observation - Call clinic for new or changing moles - Recommend daily use of broad spectrum spf 30+ sunscreen to sun-exposed areas.   Varicose Veins/Spider Veins - Dilated blue, purple or red veins at the lower extremities - Reassured - Smaller vessels can be treated by sclerotherapy (a procedure to inject a medicine into the veins to make them disappear) if desired, but the treatment is not covered by insurance. Larger vessels may be covered if symptomatic and we would refer to vascular surgeon if treatment desired.   INFLAMED SEBORRHEIC KERATOSIS (17) face x 17 (17) Symptomatic, irritating, patient would like treated. Destruction of lesion - face x 17 (17) Complexity: simple   Destruction  method: cryotherapy   Informed consent: discussed and consent obtained   Timeout:  patient name, date of birth, surgical site, and procedure verified Lesion destroyed using liquid  nitrogen: Yes   Region frozen until ice ball extended beyond lesion: Yes   Outcome: patient tolerated procedure well with no complications   Post-procedure details: wound care instructions given    Return in about 1 year (around 10/04/2025) for TBSE.  IEleanor Blush, CMA, am acting as scribe for Alm Rhyme, MD.   Documentation: I have reviewed the above documentation for accuracy and completeness, and I agree with the above.  Alm Rhyme, MD

## 2024-10-04 NOTE — Patient Instructions (Addendum)
 Seborrheic Keratosis  What causes seborrheic keratoses? Seborrheic keratoses are harmless, common skin growths that first appear during adult life.  As time goes by, more growths appear.  Some people may develop a large number of them.  Seborrheic keratoses appear on both covered and uncovered body parts.  They are not caused by sunlight.  The tendency to develop seborrheic keratoses can be inherited.  They vary in color from skin-colored to gray, brown, or even black.  They can be either smooth or have a rough, warty surface.   Seborrheic keratoses are superficial and look as if they were stuck on the skin.  Under the microscope this type of keratosis looks like layers upon layers of skin.  That is why at times the top layer may seem to fall off, but the rest of the growth remains and re-grows.    Treatment Seborrheic keratoses do not need to be treated, but can easily be removed in the office.  Seborrheic keratoses often cause symptoms when they rub on clothing or jewelry.  Lesions can be in the way of shaving.  If they become inflamed, they can cause itching, soreness, or burning.  Removal of a seborrheic keratosis can be accomplished by freezing, burning, or surgery. If any spot bleeds, scabs, or grows rapidly, please return to have it checked, as these can be an indication of a skin cancer.   Cryotherapy Aftercare  Wash gently with soap and water everyday.   Apply Vaseline and Band-Aid daily until healed.     Melanoma ABCDEs  Melanoma is the most dangerous type of skin cancer, and is the leading cause of death from skin disease.  You are more likely to develop melanoma if you: Have light-colored skin, light-colored eyes, or red or blond hair Spend a lot of time in the sun Tan regularly, either outdoors or in a tanning bed Have had blistering sunburns, especially during childhood Have a close family member who has had a melanoma Have atypical moles or large birthmarks  Early detection  of melanoma is key since treatment is typically straightforward and cure rates are extremely high if we catch it early.   The first sign of melanoma is often a change in a mole or a new dark spot.  The ABCDE system is a way of remembering the signs of melanoma.  A for asymmetry:  The two halves do not match. B for border:  The edges of the growth are irregular. C for color:  A mixture of colors are present instead of an even brown color. D for diameter:  Melanomas are usually (but not always) greater than 6mm - the size of a pencil eraser. E for evolution:  The spot keeps changing in size, shape, and color.  Please check your skin once per month between visits. You can use a small mirror in front and a large mirror behind you to keep an eye on the back side or your body.   If you see any new or changing lesions before your next follow-up, please call to schedule a visit.  Please continue daily skin protection including broad spectrum sunscreen SPF 30+ to sun-exposed areas, reapplying every 2 hours as needed when you're outdoors.   Staying in the shade or wearing long sleeves, sun glasses (UVA+UVB protection) and wide brim hats (4-inch brim around the entire circumference of the hat) are also recommended for sun protection.     Due to recent changes in healthcare laws, you may see results of your  pathology and/or laboratory studies on MyChart before the doctors have had a chance to review them. We understand that in some cases there may be results that are confusing or concerning to you. Please understand that not all results are received at the same time and often the doctors may need to interpret multiple results in order to provide you with the best plan of care or course of treatment. Therefore, we ask that you please give us  2 business days to thoroughly review all your results before contacting the office for clarification. Should we see a critical lab result, you will be contacted  sooner.   If You Need Anything After Your Visit  If you have any questions or concerns for your doctor, please call our main line at [phone] and press option 4 to reach your doctor's medical assistant. If no one answers, please leave a voicemail as directed and we will return your call as soon as possible. Messages left after 4 pm will be answered the following business day.   You may also send us  a message via MyChart. We typically respond to MyChart messages within 1-2 business days.  For prescription refills, please ask your pharmacy to contact our office. Our fax number is [phone].  If you have an urgent issue when the clinic is closed that cannot wait until the next business day, you can page your doctor at the number below.    Please note that while we do our best to be available for urgent issues outside of office hours, we are not available 24/7.   If you have an urgent issue and are unable to reach us , you may choose to seek medical care at your doctor's office, retail clinic, urgent care center, or emergency room.  If you have a medical emergency, please immediately call 911 or go to the emergency department.  Pager Numbers  - Dr. Hester: [phone]  - Dr. Jackquline: [phone]  - Dr. Claudene: [phone]   - Dr. Raymund: [phone]  In the event of inclement weather, please call our main line at [phone] for an update on the status of any delays or closures.  Dermatology Medication Tips: Please keep the boxes that topical medications come in in order to help keep track of the instructions about where and how to use these. Pharmacies typically print the medication instructions only on the boxes and not directly on the medication tubes.   If your medication is too expensive, please contact our office at [phone] option 4 or send us  a message through MyChart.   We are unable to tell what your co-pay for medications will be in advance as this is  different depending on your insurance coverage. However, we may be able to find a substitute medication at lower cost or fill out paperwork to get insurance to cover a needed medication.   If a prior authorization is required to get your medication covered by your insurance company, please allow us  1-2 business days to complete this process.  Drug prices often vary depending on where the prescription is filled and some pharmacies may offer cheaper prices.  The website www.goodrx.com contains coupons for medications through different pharmacies. The prices here do not account for what the cost may be with help from insurance (it may be cheaper with your insurance), but the website can give you the price if you did not use any insurance.  - You can print the associated coupon and take it with your prescription to the pharmacy.  -  You may also stop by our office during regular business hours and pick up a GoodRx coupon card.  - If you need your prescription sent electronically to a different pharmacy, notify our office through [facility] or by phone at [phone] option 4.     Si Usted Necesita Algo Despus de Su Visita  Tambin puede enviarnos un mensaje a travs de Clinical cytogeneticist. Por lo general respondemos a los mensajes de MyChart en el transcurso de 1 a 2 das hbiles.  Para renovar recetas, por favor pida a su farmacia que se ponga en contacto con nuestra oficina. Randi lakes de fax es [place] [phone].  Si tiene un asunto urgente cuando la clnica est cerrada y que no puede esperar hasta el siguiente da hbil, puede llamar/localizar a su doctor(a) al nmero que aparece a continuacin.   Por favor, tenga en cuenta que aunque hacemos todo lo posible para estar disponibles para asuntos urgentes fuera del horario de [place], no estamos disponibles las 24 horas del da, los 7 [address]  [address] de la [place].   Si tiene un problema urgente y no puede comunicarse con nosotros, puede optar por buscar  atencin mdica  en el consultorio de su doctor(a), en una clnica privada, en un centro de atencin urgente o en una sala de emergencias.  Si tiene Engineer, drilling, por favor llame inmediatamente al 911 o vaya a la sala de emergencias.  Nmeros de bper  - Dr. Hester: [phone]  - Dra. Jackquline: [phone]  - Dr. Claudene: [phone]  - Dra. Kitts: [phone]  En caso de inclemencias del [place], por favor llame a nuestra lnea principal al [phone] para una actualizacin sobre el estado de cualquier retraso o cierre.  Consejos para la medicacin en dermatologa: Por favor, guarde las cajas en las que vienen los medicamentos de uso tpico para ayudarle a seguir las instrucciones sobre dnde y cmo usarlos. Las farmacias generalmente imprimen las instrucciones del medicamento slo en las cajas y no directamente en los tubos del [place].   Si su medicamento es muy caro, por favor, pngase en contacto con landry rieger llamando al [phone] y presione la opcin 4 o envenos un mensaje a travs de Clinical cytogeneticist.   No podemos decirle cul ser su copago por los medicamentos por adelantado ya que esto es diferente dependiendo de la cobertura de su seguro. Sin embargo, es posible que podamos encontrar un medicamento sustituto a Audiological scientist un formulario para que el seguro cubra el medicamento que se considera necesario.   Si se requiere una autorizacin previa para que su compaa de seguros malta su medicamento, por favor permtanos de 1 a 2 das hbiles para completar este proceso.  Los precios de los medicamentos varan con frecuencia dependiendo del Environmental consultant de dnde se surte la receta y alguna farmacias pueden ofrecer precios ms baratos.  El sitio web www.goodrx.com tiene cupones para medicamentos de Health and safety inspector. Los precios aqu no tienen en cuenta lo que podra costar con la ayuda del seguro (puede ser ms barato con su seguro), pero el sitio web puede  darle el precio si no utiliz Tourist information centre manager.  - Puede imprimir el cupn correspondiente y llevarlo con su receta a la farmacia.  - Tambin puede pasar por nuestra oficina durante el horario de atencin regular y Education officer, museum una tarjeta de cupones de GoodRx.  - Si necesita que su receta se enve electrnicamente a Psychiatrist, informe a nuestra oficina a travs de MyChart de [employer] o por  telfono llamando al [phone] y presione la opcin 4.

## 2024-10-08 ENCOUNTER — Encounter: Payer: Self-pay | Admitting: Dermatology

## 2024-12-14 ENCOUNTER — Other Ambulatory Visit: Payer: Self-pay | Admitting: Internal Medicine

## 2024-12-14 ENCOUNTER — Other Ambulatory Visit
Admission: RE | Admit: 2024-12-14 | Discharge: 2024-12-14 | Disposition: A | Discharge status: Home / Self Care | Source: Ambulatory Visit | Attending: Internal Medicine | Admitting: Internal Medicine

## 2024-12-14 ENCOUNTER — Ambulatory Visit
Admission: RE | Admit: 2024-12-14 | Discharge: 2024-12-14 | Disposition: A | Discharge status: Home / Self Care | Source: Ambulatory Visit | Attending: Internal Medicine | Admitting: Internal Medicine

## 2024-12-14 DIAGNOSIS — I4891 Unspecified atrial fibrillation: Secondary | ICD-10-CM | POA: Insufficient documentation

## 2024-12-14 DIAGNOSIS — R7989 Other specified abnormal findings of blood chemistry: Secondary | ICD-10-CM | POA: Diagnosis present

## 2024-12-14 DIAGNOSIS — I10 Essential (primary) hypertension: Secondary | ICD-10-CM | POA: Diagnosis present

## 2024-12-14 DIAGNOSIS — R0602 Shortness of breath: Secondary | ICD-10-CM | POA: Insufficient documentation

## 2024-12-14 DIAGNOSIS — R0609 Other forms of dyspnea: Secondary | ICD-10-CM | POA: Diagnosis present

## 2024-12-14 LAB — D-DIMER, QUANTITATIVE: D-Dimer, Quant: 1.02 ug{FEU}/mL — ABNORMAL HIGH (ref 0.00–0.50)

## 2024-12-14 MED ORDER — IOHEXOL 350 MG/ML SOLN
75.0000 mL | Freq: Once | INTRAVENOUS | Status: AC | PRN
  Administered 2024-12-14: 75 mL via INTRAVENOUS

## 2025-10-10 ENCOUNTER — Ambulatory Visit: Admitting: Dermatology
